# Patient Record
Sex: Female | Born: 1965 | Race: White | Hispanic: No | Marital: Married | Smoking: Never smoker
Health system: Southern US, Community
[De-identification: ages and names within clinical notes are randomized; demographics above are authoritative.]

## PROBLEM LIST (undated history)

## (undated) DIAGNOSIS — I341 Nonrheumatic mitral (valve) prolapse: Secondary | ICD-10-CM

## (undated) DIAGNOSIS — T7840XA Allergy, unspecified, initial encounter: Secondary | ICD-10-CM

## (undated) DIAGNOSIS — D473 Essential (hemorrhagic) thrombocythemia: Secondary | ICD-10-CM

## (undated) DIAGNOSIS — I639 Cerebral infarction, unspecified: Secondary | ICD-10-CM

## (undated) DIAGNOSIS — N809 Endometriosis, unspecified: Secondary | ICD-10-CM

## (undated) HISTORY — DX: Essential (hemorrhagic) thrombocythemia: D47.3

## (undated) HISTORY — PX: ROOT CANAL: SHX2363

## (undated) HISTORY — PX: WISDOM TOOTH EXTRACTION: SHX21

---

## 2014-03-22 DIAGNOSIS — I639 Cerebral infarction, unspecified: Secondary | ICD-10-CM

## 2014-03-22 HISTORY — DX: Cerebral infarction, unspecified: I63.9

## 2014-03-23 ENCOUNTER — Inpatient Hospital Stay (HOSPITAL_COMMUNITY)
Admission: EM | Admit: 2014-03-23 | Discharge: 2014-03-28 | DRG: 064 | Disposition: A | Discharge status: Home / Self Care | Payer: Medicaid Other | Attending: Neurology | Admitting: Neurology

## 2014-03-23 ENCOUNTER — Emergency Department (HOSPITAL_COMMUNITY): Payer: Medicaid Other

## 2014-03-23 ENCOUNTER — Encounter (HOSPITAL_COMMUNITY): Payer: Self-pay | Admitting: Emergency Medicine

## 2014-03-23 DIAGNOSIS — R29898 Other symptoms and signs involving the musculoskeletal system: Secondary | ICD-10-CM | POA: Diagnosis present

## 2014-03-23 DIAGNOSIS — G936 Cerebral edema: Secondary | ICD-10-CM | POA: Diagnosis present

## 2014-03-23 DIAGNOSIS — D45 Polycythemia vera: Secondary | ICD-10-CM | POA: Diagnosis present

## 2014-03-23 DIAGNOSIS — F3289 Other specified depressive episodes: Secondary | ICD-10-CM | POA: Diagnosis present

## 2014-03-23 DIAGNOSIS — G8190 Hemiplegia, unspecified affecting unspecified side: Secondary | ICD-10-CM | POA: Diagnosis present

## 2014-03-23 DIAGNOSIS — I619 Nontraumatic intracerebral hemorrhage, unspecified: Secondary | ICD-10-CM | POA: Diagnosis present

## 2014-03-23 DIAGNOSIS — F329 Major depressive disorder, single episode, unspecified: Secondary | ICD-10-CM | POA: Diagnosis present

## 2014-03-23 DIAGNOSIS — E876 Hypokalemia: Secondary | ICD-10-CM

## 2014-03-23 DIAGNOSIS — I629 Nontraumatic intracranial hemorrhage, unspecified: Secondary | ICD-10-CM

## 2014-03-23 DIAGNOSIS — D72829 Elevated white blood cell count, unspecified: Secondary | ICD-10-CM | POA: Diagnosis present

## 2014-03-23 DIAGNOSIS — I639 Cerebral infarction, unspecified: Secondary | ICD-10-CM | POA: Diagnosis present

## 2014-03-23 DIAGNOSIS — I059 Rheumatic mitral valve disease, unspecified: Secondary | ICD-10-CM | POA: Diagnosis present

## 2014-03-23 DIAGNOSIS — Z79899 Other long term (current) drug therapy: Secondary | ICD-10-CM

## 2014-03-23 HISTORY — DX: Cerebral infarction, unspecified: I63.9

## 2014-03-23 HISTORY — DX: Nonrheumatic mitral (valve) prolapse: I34.1

## 2014-03-23 LAB — COMPREHENSIVE METABOLIC PANEL
ALK PHOS: 71 U/L (ref 39–117)
ALT: 11 U/L (ref 0–35)
AST: 15 U/L (ref 0–37)
Albumin: 3.6 g/dL (ref 3.5–5.2)
BUN: 17 mg/dL (ref 6–23)
CO2: 27 meq/L (ref 19–32)
Calcium: 8.7 mg/dL (ref 8.4–10.5)
Chloride: 104 mEq/L (ref 96–112)
Creatinine, Ser: 0.58 mg/dL (ref 0.50–1.10)
GLUCOSE: 103 mg/dL — AB (ref 70–99)
Potassium: 4.2 mEq/L (ref 3.7–5.3)
SODIUM: 141 meq/L (ref 137–147)
Total Bilirubin: 0.4 mg/dL (ref 0.3–1.2)
Total Protein: 6.4 g/dL (ref 6.0–8.3)

## 2014-03-23 LAB — DIFFERENTIAL
Basophils Absolute: 0.2 10*3/uL — ABNORMAL HIGH (ref 0.0–0.1)
Basophils Relative: 1 % (ref 0–1)
EOS ABS: 0.3 10*3/uL (ref 0.0–0.7)
Eosinophils Relative: 2 % (ref 0–5)
LYMPHS ABS: 1.9 10*3/uL (ref 0.7–4.0)
LYMPHS PCT: 15 % (ref 12–46)
MONOS PCT: 5 % (ref 3–12)
Monocytes Absolute: 0.6 10*3/uL (ref 0.1–1.0)
Neutro Abs: 9.6 10*3/uL — ABNORMAL HIGH (ref 1.7–7.7)
Neutrophils Relative %: 77 % (ref 43–77)

## 2014-03-23 LAB — CBC
HCT: 53.6 % — ABNORMAL HIGH (ref 36.0–46.0)
HEMOGLOBIN: 18 g/dL — AB (ref 12.0–15.0)
MCH: 32.4 pg (ref 26.0–34.0)
MCHC: 33.6 g/dL (ref 30.0–36.0)
MCV: 96.6 fL (ref 78.0–100.0)
PLATELETS: 584 10*3/uL — AB (ref 150–400)
RBC: 5.55 MIL/uL — AB (ref 3.87–5.11)
RDW: 13.3 % (ref 11.5–15.5)
WBC: 12.4 10*3/uL — AB (ref 4.0–10.5)

## 2014-03-23 LAB — URINALYSIS, ROUTINE W REFLEX MICROSCOPIC
Bilirubin Urine: NEGATIVE
GLUCOSE, UA: NEGATIVE mg/dL
Ketones, ur: NEGATIVE mg/dL
Leukocytes, UA: NEGATIVE
Nitrite: NEGATIVE
PH: 6 (ref 5.0–8.0)
Protein, ur: NEGATIVE mg/dL
SPECIFIC GRAVITY, URINE: 1.01 (ref 1.005–1.030)
Urobilinogen, UA: 0.2 mg/dL (ref 0.0–1.0)

## 2014-03-23 LAB — TROPONIN I

## 2014-03-23 LAB — APTT: aPTT: 33 seconds (ref 24–37)

## 2014-03-23 LAB — URINE MICROSCOPIC-ADD ON

## 2014-03-23 LAB — RAPID URINE DRUG SCREEN, HOSP PERFORMED
Amphetamines: NOT DETECTED
Barbiturates: NOT DETECTED
Benzodiazepines: NOT DETECTED
Cocaine: NOT DETECTED
OPIATES: NOT DETECTED
Tetrahydrocannabinol: NOT DETECTED

## 2014-03-23 LAB — PROTIME-INR
INR: 1.06 (ref 0.00–1.49)
PROTHROMBIN TIME: 13.6 s (ref 11.6–15.2)

## 2014-03-23 LAB — ETHANOL: Alcohol, Ethyl (B): <11 mg/dL (ref 0–11)

## 2014-03-23 NOTE — ED Provider Notes (Signed)
CSN: [id number]     Arrival date & time 03/23/14  1940 History   First MD Initiated Contact with Patient 03/23/14 2045     Chief Complaint  Patient presents with  . Weakness     (Consider location/radiation/quality/duration/timing/severity/associated sxs/prior Treatment) HPI 48 year old healthy female LKW noon yesterday noticed acute onset yesterday at about noon of left arm and leg of moderate weakness with slight numbness with no headache no trauma no change in speech vision swallowing or understanding no vertigo no chest pain no palpitations no shortness of breath no lightheadedness no syncope no pain but she has had persistent moderate left arm and leg weakness since yesterday; not a code stroke candidate. There is no treatment prior to arrival. Symptoms are constant and not worsening but not improving. Past Medical History  Diagnosis Date  . Mitral valve prolapse   . Stroke 03/22/2014   Past Surgical History  Procedure Laterality Date  . Root canal     History reviewed. No pertinent family history. History  Substance Use Topics  . Smoking status: Never Smoker   . Smokeless tobacco: Not on file  . Alcohol Use: Yes     Comment: rarely per pt   OB History   Grav Para Term Preterm Abortions TAB SAB Ect Mult Living                 Review of Systems 10 Systems reviewed and are negative for acute change except as noted in the HPI.   Allergies  Review of patient's allergies indicates no known allergies.  Home Medications   Prior to Admission medications   Not on File   BP 130/53  Pulse 70  Temp(Src) 98 F (36.7 C) (Oral)  Resp 16  Ht 5\' 4"  (1.626 m)  Wt 113 lb 5.1 oz (51.4 kg)  BMI 19.44 kg/m2  SpO2 98%  LMP 03/16/2014 Physical Exam  Nursing note and vitals reviewed. Constitutional: She is oriented to person, place, and time.  Awake, alert, nontoxic appearance with baseline speech for patient.  HENT:  Head: Atraumatic.  Mouth/Throat: No oropharyngeal exudate.   Eyes: EOM are normal. Pupils are equal, round, and reactive to light. Right eye exhibits no discharge. Left eye exhibits no discharge.  Neck: Neck supple.  Cardiovascular: Normal rate and regular rhythm.   No murmur heard. Pulmonary/Chest: Effort normal and breath sounds normal. No stridor. No respiratory distress. She has no wheezes. She has no rales. She exhibits no tenderness.  Abdominal: Soft. Bowel sounds are normal. She exhibits no mass. There is no tenderness. There is no rebound.  Musculoskeletal: She exhibits no tenderness.  Baseline ROM, moves extremities with no obvious new focal weakness.  Lymphadenopathy:    She has no cervical adenopathy.  Neurological: She is alert and oriented to person, place, and time.  Awake, alert, cooperative and aware of situation; motor strength 5 out of 5 right arm and leg and 4/5 left arm and leg; sensation normal to light touch right arm and leg but slight decreased light touch left arm and leg; peripheral visual fields full to confrontation; no facial asymmetry; tongue midline; major cranial nerves appear intact; mild left arm and leg pronator drift, normal finger to nose bilaterally, gait without new ataxia but does have abnormal gait due to left leg weakness.  Skin: No rash noted.  Psychiatric: She has a normal mood and affect.    ED Course  Procedures (including critical care time) Patient / Family / Caregiver understand and agree with initial  ED impression and plan with expectations set for ED visit. Pt stable in ED with no significant deterioration in condition. [facility] paged. 2240 D/w Neuro for transfer to [facility]. 2250 Patient / Family / Caregiver informed of clinical course, understand medical decision-making process, and agree with plan. [place] Performed by: Babette Relic Total critical care time: 22min (risk of re-bleed and decompensation; transfer to ICU) Critical care time was exclusive of separately billable  procedures and treating other patients. Critical care was necessary to treat or prevent imminent or life-threatening deterioration. Critical care was time spent personally by me on the following activities: development of treatment plan with patient and/or surrogate as well as nursing, discussions with consultants, evaluation of patient's response to treatment, examination of patient, obtaining history from patient or surrogate, ordering and performing treatments and interventions, ordering and review of laboratory studies, ordering and review of radiographic studies, pulse oximetry and re-evaluation of patient's condition.  Labs Review Labs Reviewed  CBC - Abnormal; Notable for the following:    WBC 12.4 (*)    RBC 5.55 (*)    Hemoglobin 18.0 (*)    HCT 53.6 (*)    Platelets 584 (*)    All other components within normal limits  DIFFERENTIAL - Abnormal; Notable for the following:    Neutro Abs 9.6 (*)    Basophils Absolute 0.2 (*)    All other components within normal limits  COMPREHENSIVE METABOLIC PANEL - Abnormal; Notable for the following:    Glucose, Bld 103 (*)    All other components within normal limits  URINALYSIS, ROUTINE W REFLEX MICROSCOPIC - Abnormal; Notable for the following:    Hgb urine dipstick MODERATE (*)    All other components within normal limits  BASIC METABOLIC PANEL - Abnormal; Notable for the following:    Potassium 3.6 (*)    Calcium 8.1 (*)    All other components within normal limits  CBC - Abnormal; Notable for the following:    WBC 11.4 (*)    RBC 5.22 (*)    Hemoglobin 17.1 (*)    HCT 51.1 (*)    Platelets 514 (*)    All other components within normal limits  C3 COMPLEMENT - Abnormal; Notable for the following:    C3 Complement 79 (*)    All other components within normal limits  ANTIPHOSPHOLIPID SYNDROME EVAL, BLD - Abnormal; Notable for the following:    Anticardiolipin IgA 7 (*)    Anticardiolipin IgG 0 (*)    Anticardiolipin IgM 2 (*)     All other components within normal limits  CBC - Abnormal; Notable for the following:    WBC 11.2 (*)    RBC 5.42 (*)    Hemoglobin 17.6 (*)    HCT 52.9 (*)    Platelets 553 (*)    All other components within normal limits  MRSA PCR SCREENING  CULTURE, BLOOD (ROUTINE X 2)  CULTURE, BLOOD (ROUTINE X 2)  ETHANOL  PROTIME-INR  APTT  URINE RAPID DRUG SCREEN (HOSP PERFORMED)  TROPONIN I  URINE MICROSCOPIC-ADD ON  TROPONIN I  TROPONIN I  TROPONIN I  ANA  C4 COMPLEMENT  SEDIMENTATION RATE  COMPLEMENT, TOTAL    Imaging Review US Renal  03/26/2014   CLINICAL DATA:  polycythemia in setting a brain hmg, evaluate kidneys  EXAM: RENAL/URINARY TRACT ULTRASOUND COMPLETE  COMPARISON:  None.  FINDINGS: Right Kidney:  Length: 11.9 cm. Echogenicity within normal limits. No mass or hydronephrosis visualized.  Left Kidney:  Length: 12.5 cm. Echogenicity within normal limits. No mass or hydronephrosis visualized.  Bladder:  Appears normal for degree of bladder distention.  Heterogeneous hypoechoic mass in the left adnexum with punctate calcifications. This finding is partially evaluated and further evaluation dedicated pelvic ultrasound recommended.  IMPRESSION: Indeterminate hypoechoic mass left adnexa further evaluation with dedicated pelvic ultrasound recommended. Otherwise unremarkable renal ultrasound   Electronically Signed   By: Margaree Mackintosh M.D.   On: 03/26/2014 18:11   Ir Angio Intra Extracran Sel Com Carotid Innominate Bilat Mod Sed  03/26/2014   CLINICAL DATA:  Right frontal intracranial hemorrhage.  EXAM: BILATERAL COMMON CAROTID AND INNOMINATE ANGIOGRAPHY AND BILATERAL VERTEBRAL ARTERY ANGIOGRAMS:  ANESTHESIA/SEDATION: Conscious sedation.  MEDICATIONS: Versed 1 mg IV.  Fentanyl 25 mcg IV.  CONTRAST:  24mL OMNIPAQUE IOHEXOL 300 MG/ML  SOLN  PROCEDURE: Following a full explanation of the procedure along with the potential associated complications, an informed witnessed consent was obtained.  The  right groin was prepped and draped in the usual sterile fashion. Thereafter, using a modified Seldinger technique, transfemoral access into the right common femoral artery was obtained without difficulty. Over a 0.035 inch guidewire, a 5 French Pinnacle sheath was inserted. Through this, and also over a 0.035 inch guidewire, a 5 French JB1 catheter was advanced to the aortic arch and selectively positioned in the right vertebral artery, the right common carotid artery, the left common carotid artery and left vertebral artery.  The patient tolerated the procedure well.  COMPLICATIONS: None immediate  FINDINGS: The right vertebral artery origin is normal.  The vessel opacifies normally to the cranial skull base. There is normal opacification of the right posterior inferior cerebellar artery and the right vertebrobasilar junction.  The opacified portions of the basilar artery, the posterior cerebral arteries, the superior cerebellar arteries and the anterior-inferior cerebellar arteries are normal in the capillary and the venous phases. Unopacified blood is seen in the basilar artery from the contralateral more dominant vertebral artery.  The right common carotid arteriogram demonstrates the right external carotid artery and its major branches to be normal.  The right internal carotid artery at the bulb to the cranial skull base opacifies normally.  The petrous, the cavernous and the supraclinoid segments are widely patent.  A right posterior communicating artery is seen opacifying the right posterior cerebral artery distribution.  The right middle cerebral artery and the right anterior cerebral artery are seen to opacify into the capillary and the venous phases.  There is mild abnormal prominence of the distal right pericallosal artery in the subcortical cortical area associated with an underlying, moderate sized area of hypoperfusion at the site of the hemorrhage. However, there is no evidence angiographically at  this time of arteriovenous shunting, or appearance of a nidus, or dissections or of intraluminal filling defects. The adjacent cortical veins appear widely patent with antegrade brisk flow into the superior sagittal sinus. The dural venous sinuses otherwise remain widely patent.  The left common carotid arteriogram demonstrates left external carotid artery and its major branches to be normal .  Left internal carotid artery at the bulb to the cranial skull base opacifies normally. The petrous the cavernous and the supraclinoid segments opacify normally.  A left posterior communicating artery is seen opacifying the left posterior cerebral artery distribution.  The left middle and the left anterior cerebral arteries opacify normally into the capillary and the venous phases.  The left vertebral artery origin is normal.  The vessel opacifies normally to  the cranial skull base. Normal opacification of the left posterior inferior cerebellar artery and the left vertebrobasilar junction is seen.  The basilar artery, the posterior cerebral arteries, superior cerebellar arteries and the anterior inferior cerebellar arteries opacify normally into the capillary and the venous phases.  IMPRESSION: Angiographically mildly prominent distal right pericallosal artery, without evidence of arteriovenous shunting, or of angiographic presence of a nidus being noted in the right anterior to mid frontal cortical subcortical area. No evidence of a dural AV fistula, dissections or of intraluminal filling defects seen.  Venous drainage within normal limits.  Given the above angiographic findings, a follow-up study is suggested in order to evaluate for possibly of an underlying vessel malformation not visualized at this time.   Electronically Signed   By: Luanne Bras M.D.   On: 03/25/2014 11:12     EKG Interpretation None     ECG Muse not working: Sinus rhythm, ventricular rate 71, normal axis, left ventricular hypertrophy septal  Q waves, no comparison ECG available MDM   Final diagnoses:  Intracranial hemorrhage    The patient appears reasonably stabilized for transfer considering the current resources, flow, and capabilities available in the ED at this time, and I doubt any other [facility] requiring further screening and/or treatment in the ED prior to transfer.    Babette Relic, MD 03/26/14 [phone]

## 2014-03-23 NOTE — ED Notes (Signed)
Report given to Denyse Amass, RN at [facility] Neuro ICU

## 2014-03-23 NOTE — ED Notes (Signed)
Patient reports "I just don't feel right and my left side is weak." Patient reports noticed yesterday while attempting to fold clothes she couldn't fold them, and she states she had trouble getting jacket on. Reports today, approximately an hour ago, she lost her balance and fell backwards.

## 2014-03-24 ENCOUNTER — Inpatient Hospital Stay (HOSPITAL_COMMUNITY): Payer: Medicaid Other

## 2014-03-24 ENCOUNTER — Encounter (HOSPITAL_COMMUNITY): Payer: Self-pay | Admitting: Radiology

## 2014-03-24 DIAGNOSIS — R58 Hemorrhage, not elsewhere classified: Secondary | ICD-10-CM

## 2014-03-24 DIAGNOSIS — I639 Cerebral infarction, unspecified: Secondary | ICD-10-CM | POA: Diagnosis present

## 2014-03-24 DIAGNOSIS — I629 Nontraumatic intracranial hemorrhage, unspecified: Secondary | ICD-10-CM

## 2014-03-24 LAB — TROPONIN I
Troponin I: <0.3 ng/mL (ref <0.30)
Troponin I: <0.3 ng/mL (ref <0.30)

## 2014-03-24 LAB — MRSA PCR SCREENING: MRSA by PCR: NEGATIVE

## 2014-03-24 MED ORDER — SODIUM CHLORIDE 0.9 % IV BOLUS (SEPSIS)
1000.0000 mL | Freq: Once | INTRAVENOUS | Status: AC
  Administered 2014-03-24: 1000 mL via INTRAVENOUS

## 2014-03-24 MED ORDER — IOHEXOL 350 MG/ML SOLN
50.0000 mL | Freq: Once | INTRAVENOUS | Status: AC | PRN
  Administered 2014-03-24: 50 mL via INTRAVENOUS

## 2014-03-24 MED ORDER — ACETAMINOPHEN 325 MG PO TABS
650.0000 mg | ORAL_TABLET | ORAL | Status: DC | PRN
  Administered 2014-03-24 – 2014-03-27 (×7): 650 mg via ORAL
  Filled 2014-03-24 (×7): qty 2

## 2014-03-24 MED ORDER — CLINDAMYCIN HCL 300 MG PO CAPS
450.0000 mg | ORAL_CAPSULE | Freq: Three times a day (TID) | ORAL | Status: DC
  Filled 2014-03-24 (×2): qty 1

## 2014-03-24 MED ORDER — ACETAMINOPHEN 500 MG PO TABS
500.0000 mg | ORAL_TABLET | Freq: Once | ORAL | Status: AC
  Administered 2014-03-24: 500 mg via ORAL
  Filled 2014-03-24: qty 1

## 2014-03-24 MED ORDER — SENNOSIDES-DOCUSATE SODIUM 8.6-50 MG PO TABS
1.0000 | ORAL_TABLET | Freq: Two times a day (BID) | ORAL | Status: DC
  Administered 2014-03-24 – 2014-03-27 (×6): 1 via ORAL
  Filled 2014-03-24 (×8): qty 1

## 2014-03-24 MED ORDER — ACETAMINOPHEN 650 MG RE SUPP: 650.0000 mg | RECTAL | Status: DC | PRN

## 2014-03-24 MED ORDER — PANTOPRAZOLE SODIUM 40 MG IV SOLR
40.0000 mg | Freq: Every day | INTRAVENOUS | Status: DC
  Administered 2014-03-24 (×2): 40 mg via INTRAVENOUS
  Filled 2014-03-24 (×3): qty 40

## 2014-03-24 MED ORDER — CLINDAMYCIN HCL 300 MG PO CAPS
450.0000 mg | ORAL_CAPSULE | Freq: Three times a day (TID) | ORAL | Status: AC
  Administered 2014-03-24 – 2014-03-26 (×9): 450 mg via ORAL
  Filled 2014-03-24 (×11): qty 1

## 2014-03-24 MED ORDER — SODIUM CHLORIDE 0.9 % IV SOLN
INTRAVENOUS | Status: DC
  Administered 2014-03-24 – 2014-03-27 (×3): via INTRAVENOUS

## 2014-03-24 MED ORDER — LABETALOL HCL 5 MG/ML IV SOLN
10.0000 mg | INTRAVENOUS | Status: DC | PRN
  Administered 2014-03-24 (×2): 5 mg via INTRAVENOUS
  Filled 2014-03-24: qty 4
  Filled 2014-03-24: qty 8

## 2014-03-24 NOTE — Progress Notes (Signed)
Nutrition Brief Note  Patient identified on the Malnutrition Screening Tool (MST) Report  Pt admitted for stroke work up and is currently in MRI. Daughter in room and provides hx. Per daughter, pt and husband separated 9 months ago. After their separation pt went on a juice fasting diet to lose weight. Pt lost almost 30 lb during that time. Pt's weight has now been stable for the last 4 months and pt is eating normally again.   Wt Readings from Last 15 Encounters:  03/24/14 113 lb 5.1 oz (51.4 kg)    Body mass index is 19.44 kg/(m^2). BMI now WNL.   Current diet order is NPO for tests. Labs and medications reviewed.   No nutrition interventions warranted at this time. If nutrition issues arise, please consult RD.   [place], [place], [place] Pager [phone] After Hours Pager

## 2014-03-24 NOTE — Progress Notes (Signed)
Stroke Team Progress Note  HISTORY Regina [name] is a 48 y.o. female with a history of MVP who presents with left-sided weakness that started yesterday 5/2 around lunchtime. She states that she noticed that she was having difficulty folding clothes. Later that day when she lay down for a nap, she noticed some headache. This has since resolved. Today 03/23/2014, she stumbled and fell against a dishwasher and her child insisted that she come into the emergency room. CT demonstrated a right frontal lobar hemorrhage. Patient was not administered TPA secondary to hemorrahge. She was admitted to the neuro ICU for further evaluation and treatment. No prior history of DVT, pulmonary embolism, rash, joint problems. Denies history of drug abuse., Birth control pills,  SUBJECTIVE Her RN is at the bedside, no family.  Overall she feels her condition is stable. 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 Kids:  81, 18, 17 & 15  OBJECTIVE Most recent Vital Signs: Filed Vitals:   03/24/14 0530 03/24/14 0600 03/24/14 0630 03/24/14 0700  BP: 132/73 131/75 141/83 139/80  Pulse: 57 53 57 66  Temp:      TempSrc:      Resp: 14 12 16 16   Height:      Weight:      SpO2: 95% 97% 96% 100%   CBG (last 3)  No results found for this basename: GLUCAP,  in the last 72 hours  IV Fluid Intake:     MEDICATIONS  . clindamycin  450 mg Oral 3 times per day  . pantoprazole (PROTONIX) IV  40 mg Intravenous QHS  . senna-docusate  1 tablet Oral BID   PRN:  acetaminophen, acetaminophen, labetalol  Diet:  NPO  Activity:  Bedrest DVT Prophylaxis:  SCDs   CLINICALLY SIGNIFICANT STUDIES Basic Metabolic Panel:  Recent Labs Lab 03/23/14 2052  NA 141  K 4.2  CL 104  CO2 27  GLUCOSE 103*  BUN 17  CREATININE 0.58  CALCIUM 8.7   Liver Function Tests:  Recent Labs Lab 03/23/14 2052  AST 15  ALT 11  ALKPHOS 71  BILITOT 0.4  PROT 6.4  ALBUMIN 3.6   CBC:  Recent Labs Lab 03/23/14 2052  WBC 12.4*   NEUTROABS 9.6*  HGB 18.0*  HCT 53.6*  MCV 96.6  PLT 584*   Coagulation:  Recent Labs Lab 03/23/14 2052  LABPROT 13.6  INR 1.06   Cardiac Enzymes:  Recent Labs Lab 03/23/14 2052  TROPONINI <0.30   Urinalysis:  Recent Labs Lab 03/23/14 2252  COLORURINE YELLOW  LABSPEC 1.010  PHURINE 6.0  GLUCOSEU NEGATIVE  HGBUR MODERATE*  BILIRUBINUR NEGATIVE  KETONESUR NEGATIVE  PROTEINUR NEGATIVE  UROBILINOGEN 0.2  NITRITE NEGATIVE  LEUKOCYTESUR NEGATIVE   Lipid Panel No results found for this basename: chol, trig, hdl, cholhdl, vldl, ldlcalc   HgbA1C  No results found for this basename: HGBA1C    Urine Drug Screen:     Component Value Date/Time   LABOPIA NONE DETECTED 03/23/2014 2252   COCAINSCRNUR NONE DETECTED 03/23/2014 2252   LABBENZ NONE DETECTED 03/23/2014 2252   AMPHETMU NONE DETECTED 03/23/2014 2252   THCU NONE DETECTED 03/23/2014 2252   LABBARB NONE DETECTED 03/23/2014 2252    Alcohol Level:  Recent Labs Lab 03/23/14 2052  ETH <11    CT of the brain  03/23/2014    There is a 2 x 3.4 cm acute right frontal intraparenchymal hematoma with mild surrounding edema. There is no mass effect, hydrocephalus or transtentorial herniation.  CT angio of the head 03/24/2014   Contracting right frontal 33 x 18 mm intraparenchymal hematoma, no new hemorrhage. Local mass effect without midline shift.  Normal CT angiogram of the head.     Cerebral angio  MRI of the brain    2D Echocardiogram    CXR    EKG  normal sinus rhythm. For complete results please see formal report.   Therapy Recommendations   Physical Exam   Frail young Caucasian lady not in distress.Awake alert. Afebrile. Head is nontraumatic. Neck is supple without bruit. Hearing is normal. Cardiac exam no murmur or gallop. Lungs are clear to auscultation. Distal pulses are well felt. Neurological Exam : Awake alert oriented x 3 normal speech and language. Mild left lower face asymmetry. Tongue midline. No drift. Mild  diminished fine finger movements on left. Orbits right over left upper extremity. Mild left grip weak.. Mild diminished left hemibody sensation . Normal coordination. Gait deferred. ASSESSMENT Ms. Regina [name] is a 48 y.o. female presenting with left sided weakness. Imaging confirms a right frontal IPH. Hemorrhage etiology unclear. On no antithrombotics prior to admission. Patient with resultant left hemiparesis. Stroke work up underway.   MVP  Hx 1 miscarriage at 12 weeks  Root canal in March, 2 weeks abx prior to procedure; has had 2 rounds abx post - clindamycin 450 3 times a day x3 days given to complete her previously scheduled course of antibiotics - need to obtain records to see if finishing the course is necessary  Hospital day # 1  TREATMENT/PLAN  1L bolus NS followed by 100 cc/hr  Cerebral angiogram today  Goal SBP < 180  Transfer to the floor  Travel without RN  OOB. Therapy evals  MRI today  blood cultures x 2  BMET, CBC in am  Follow up length of abx therapy  Burnetta Sabin, MSN, RN, ANVP-BC, AGPCNP-BC [facility] Pager: [phone] 03/24/2014 7:31 AM This patient is critically ill and at significant risk of neurological worsening, death and care requires constant monitoring of vital signs, hemodynamics,respiratory and cardiac monitoring,review of multiple databases, neurological assessment, discussion with family, other specialists and medical decision making of high complexity. I spent 30 minutes of neurocritical care time  in the care of  this patient. I have personally obtained a history, examined the patient, evaluated imaging results, and formulated the assessment and plan of care. I agree with the above.  Antony Contras, MD  To contact Stroke Continuity provider, please refer to [email or web address] After hours, contact General Neurology

## 2014-03-24 NOTE — Progress Notes (Signed)
Utilization Review Completed.Neoma Laming T Dowell5/02/2014

## 2014-03-24 NOTE — Progress Notes (Signed)
Echocardiogram 2D Echocardiogram has been performed.  [place] 03/24/2014, 11:21 AM

## 2014-03-24 NOTE — Consult Note (Signed)
HPI: Regina [name] is an 48 y.o. female who has been admitted for frontal hemorrhage after becoming symptomatic on the left side and falling at home. She has improved neurologically since admission and her repeat CT shows stability of right frontal hemorrhage. IR is asked to do formal cerebral arteriogram for further assessment. PT seen with family at bedside. PMHx and meds reviewed.  Past Medical History:  Past Medical History  Diagnosis Date  . Mitral valve prolapse     Past Surgical History:  Past Surgical History  Procedure Laterality Date  . Root canal      Family History: History reviewed. No pertinent family history.  Social History:  reports that she has never smoked. She does not have any smokeless tobacco history on file. She reports that she drinks alcohol. She reports that she does not use illicit drugs.  Allergies: No Known Allergies  Medications:   Medication List    ASK your doctor about these medications       BIOTIN PO  Take 1 capsule by mouth daily.     penicillin v potassium 250 MG tablet  Commonly known as:  VEETID  Take 250 mg by mouth 3 (three) times daily. For root canal. 14 days started on 4/23        Please HPI for pertinent positives, otherwise complete 10 system ROS negative.  Physical Exam: BP 134/81  Pulse 71  Temp(Src) 97.3 F (36.3 C) (Oral)  Resp 18  Ht $R'5\' 4"'em$  (1.626 m)  Wt 113 lb 5.1 oz (51.4 kg)  BMI 19.44 kg/m2  SpO2 96%  LMP 03/16/2014 Body mass index is 19.44 kg/(m^2).   General Appearance:  Alert, cooperative, no distress, appears stated age  Head:  Normocephalic, without obvious abnormality, atraumatic  ENT: Unremarkable  Neck: Supple, symmetrical, trachea midline  Lungs:   Clear to auscultation bilaterally, no w/r/r,  Chest Wall:  No tenderness or deformity  Heart:  Regular rate and rhythm, S1, S2 normal, no murmur, rub or gallop.  Abdomen:   Soft, non-tender, non distended.  Extremities: Extremities normal,  atraumatic, no cyanosis or edema  Pulses: 2+ and symmetric femoral and pedal  Neurologic: Normal affect, still with 4/5 strength on left   Results for orders placed during the hospital encounter of 03/23/14 (from the past 48 hour(s))  ETHANOL     Status: None   Collection Time    03/23/14  8:52 PM      Result Value Ref Range   Alcohol, Ethyl (B) <11  0 - 11 mg/dL   Comment:            LOWEST DETECTABLE LIMIT FOR     SERUM ALCOHOL IS 11 mg/dL     FOR MEDICAL PURPOSES ONLY  PROTIME-INR     Status: None   Collection Time    03/23/14  8:52 PM      Result Value Ref Range   Prothrombin Time 13.6  11.6 - 15.2 seconds   INR 1.06  0.00 - 1.49  APTT     Status: None   Collection Time    03/23/14  8:52 PM      Result Value Ref Range   aPTT 33  24 - 37 seconds  CBC     Status: Abnormal   Collection Time    03/23/14  8:52 PM      Result Value Ref Range   WBC 12.4 (*) 4.0 - 10.5 K/uL   RBC 5.55 (*) 3.87 - 5.11 MIL/uL  Hemoglobin 18.0 (*) 12.0 - 15.0 g/dL   HCT 53.6 (*) 36.0 - 46.0 %   MCV 96.6  78.0 - 100.0 fL   MCH 32.4  26.0 - 34.0 pg   MCHC 33.6  30.0 - 36.0 g/dL   RDW 13.3  11.5 - 15.5 %   Platelets 584 (*) 150 - 400 K/uL  DIFFERENTIAL     Status: Abnormal   Collection Time    03/23/14  8:52 PM      Result Value Ref Range   Neutrophils Relative % 77  43 - 77 %   Neutro Abs 9.6 (*) 1.7 - 7.7 K/uL   Lymphocytes Relative 15  12 - 46 %   Lymphs Abs 1.9  0.7 - 4.0 K/uL   Monocytes Relative 5  3 - 12 %   Monocytes Absolute 0.6  0.1 - 1.0 K/uL   Eosinophils Relative 2  0 - 5 %   Eosinophils Absolute 0.3  0.0 - 0.7 K/uL   Basophils Relative 1  0 - 1 %   Basophils Absolute 0.2 (*) 0.0 - 0.1 K/uL  COMPREHENSIVE METABOLIC PANEL     Status: Abnormal   Collection Time    03/23/14  8:52 PM      Result Value Ref Range   Sodium 141  137 - 147 mEq/L   Potassium 4.2  3.7 - 5.3 mEq/L   Chloride 104  96 - 112 mEq/L   CO2 27  19 - 32 mEq/L   Glucose, Bld 103 (*) 70 - 99 mg/dL   BUN 17   6 - 23 mg/dL   Creatinine, Ser 0.58  0.50 - 1.10 mg/dL   Calcium 8.7  8.4 - 10.5 mg/dL   Total Protein 6.4  6.0 - 8.3 g/dL   Albumin 3.6  3.5 - 5.2 g/dL   AST 15  0 - 37 U/L   ALT 11  0 - 35 U/L   Alkaline Phosphatase 71  39 - 117 U/L   Total Bilirubin 0.4  0.3 - 1.2 mg/dL   GFR calc non Af Amer >90  >90 mL/min   GFR calc Af Amer >90  >90 mL/min   Comment: (NOTE)     The eGFR has been calculated using the CKD EPI equation.     This calculation has not been validated in all clinical situations.     eGFR's persistently <90 mL/min signify possible Chronic Kidney     Disease.  TROPONIN I     Status: None   Collection Time    03/23/14  8:52 PM      Result Value Ref Range   Troponin I <0.30  <0.30 ng/mL   Comment:            Due to the release kinetics of cTnI,     a negative result within the first hours     of the onset of symptoms does not rule out     myocardial infarction with certainty.     If myocardial infarction is still suspected,     repeat the test at appropriate intervals.  URINE RAPID DRUG SCREEN (HOSP PERFORMED)     Status: None   Collection Time    03/23/14 10:52 PM      Result Value Ref Range   Opiates NONE DETECTED  NONE DETECTED   Cocaine NONE DETECTED  NONE DETECTED   Benzodiazepines NONE DETECTED  NONE DETECTED   Amphetamines NONE DETECTED  NONE DETECTED   Tetrahydrocannabinol NONE  DETECTED  NONE DETECTED   Barbiturates NONE DETECTED  NONE DETECTED   Comment:            DRUG SCREEN FOR MEDICAL PURPOSES     ONLY.  IF CONFIRMATION IS NEEDED     FOR ANY PURPOSE, NOTIFY LAB     WITHIN 5 DAYS.                LOWEST DETECTABLE LIMITS     FOR URINE DRUG SCREEN     Drug Class       Cutoff (ng/mL)     Amphetamine      1000     Barbiturate      200     Benzodiazepine   673     Tricyclics       419     Opiates          300     Cocaine          300     THC              50  URINALYSIS, ROUTINE W REFLEX MICROSCOPIC     Status: Abnormal   Collection Time     03/23/14 10:52 PM      Result Value Ref Range   Color, Urine YELLOW  YELLOW   APPearance CLEAR  CLEAR   Specific Gravity, Urine 1.010  1.005 - 1.030   pH 6.0  5.0 - 8.0   Glucose, UA NEGATIVE  NEGATIVE mg/dL   Hgb urine dipstick MODERATE (*) NEGATIVE   Bilirubin Urine NEGATIVE  NEGATIVE   Ketones, ur NEGATIVE  NEGATIVE mg/dL   Protein, ur NEGATIVE  NEGATIVE mg/dL   Urobilinogen, UA 0.2  0.0 - 1.0 mg/dL   Nitrite NEGATIVE  NEGATIVE   Leukocytes, UA NEGATIVE  NEGATIVE  URINE MICROSCOPIC-ADD ON     Status: None   Collection Time    03/23/14 10:52 PM      Result Value Ref Range   Squamous Epithelial / LPF RARE  RARE   RBC / HPF 3-6  <3 RBC/hpf  MRSA PCR SCREENING     Status: None   Collection Time    03/24/14 12:19 AM      Result Value Ref Range   MRSA by PCR NEGATIVE  NEGATIVE   Comment:            The GeneXpert MRSA Assay (FDA     approved for NASAL specimens     only), is one component of a     comprehensive MRSA colonization     surveillance program. It is not     intended to diagnose MRSA     infection nor to guide or     monitor treatment for     MRSA infections.  TROPONIN I     Status: None   Collection Time    03/24/14  8:00 AM      Result Value Ref Range   Troponin I <0.30  <0.30 ng/mL   Comment:            Due to the release kinetics of cTnI,     a negative result within the first hours     of the onset of symptoms does not rule out     myocardial infarction with certainty.     If myocardial infarction is still suspected,     repeat the test at appropriate intervals.   Ct Angio Head W/cm &/or Wo Cm  03/24/2014   CLINICAL DATA:  Follow-up Foley.  EXAM: CT ANGIOGRAPHY HEAD  TECHNIQUE: Multidetector CT imaging of the head was performed using the standard protocol during bolus administration of intravenous contrast. Multiplanar CT image reconstructions and MIPs were obtained to evaluate the vascular anatomy.  CONTRAST:  25mL OMNIPAQUE IOHEXOL 350 MG/ML SOLN   COMPARISON:  CT HEAD W/O CM dated 03/23/2014  FINDINGS: High right frontal 33 x 18 mm (AP by transverse) contracting intraparenchymal cortical the subcortical hematoma, with mild surrounding low-density vasogenic edema. No midline shift. Local mass effect.  The ventricles and sulci are otherwise normal for age. No acute large vascular territory infarcts.  No abnormal extra-axial fluid collections. Basal cisterns are patent. No skull fracture. Right maxillary sinus air-fluid level, unchanged. Mastoid air cells are well aerated. No skull fracture.  Anterior circulation: Normal appearance of the cervical internal carotid arteries, petrous, cavernous and supra clinoid internal carotid arteries. Widely patent anterior communicating artery. Normal appearance of the anterior and middle cerebral arteries.  Posterior circulation: Left vertebral artery is dominant, with normal appearance of the vertebral arteries, vertebrobasilar junction and basilar artery, as well as main branch vessels. Right vertebral artery predominantly terminates in the right posterior inferior cerebellar arteries. Small bilateral posterior communicating arteries are present. Normal appearance of posterior cerebral arteries.  No vascular malformation with particular attention of the right frontal lobe. Though not tailored for evaluation, the associated cortical veins appear patent.  No large vessel occlusion, hemodynamically significant stenosis, dissection, luminal irregularity, contrast extravasation or aneurysm within the anterior nor posterior circulation.  Review of the MIP images confirms the above findings.  IMPRESSION: Contracting right frontal 33 x 18 mm intraparenchymal hematoma, no new hemorrhage. Local mass effect without midline shift.  Normal CT angiogram of the head.   Electronically Signed   By: Elon Alas   On: 03/24/2014 01:50   Ct Head Wo Contrast  03/23/2014   CLINICAL DATA:  Left-sided weakness for 1 day with headache  EXAM:  CT HEAD WITHOUT CONTRAST  TECHNIQUE: Contiguous axial images were obtained from the base of the skull through the vertex without intravenous contrast.  COMPARISON:  None.  FINDINGS: There is a right frontal intraparenchymal hematoma measuring 2 x 3.4 cm with mild surrounding edema. There is no evidence of mass effect, midline shift or extra-axial fluid collections.  The ventricles and sulci are appropriate for the patient's age. The basal cisterns are patent.  Visualized portions of the orbits are unremarkable. There is a small air-fluid level in the right maxillary sinus.  The osseous structures are unremarkable.  IMPRESSION: There is a 2 x 3.4 cm acute right frontal intraparenchymal hematoma with mild surrounding edema. There is no mass effect, hydrocephalus or transtentorial herniation. Critical Value/emergent results were called by telephone at the time of interpretation on 03/23/2014 at 10:35 PM to Dr. Riki Altes , who verbally acknowledged these results.   Electronically Signed   By: Kathreen Devoid   On: 03/23/2014 22:36    Assessment/Plan Right frontal hemorrhage For diagnostic cerebral arteriogram. No intended intervention at this time. Discussed procedure, risks, complications, use of sedation. Labs reviewed. Consent signed in chart  Ascencion Dike PA-C 03/24/2014, 10:22 AM

## 2014-03-24 NOTE — H&P (Signed)
Neurology H&P  CC: Left-sided weakness  History is obtained from: Patient  HPI: Regina [name] is a 48 y.o. female with a history of MVP who presents with left-sided weakness that started yesterday 5/2 around lunchtime. She states that she noticed that she was having difficulty folding clothes. Later that day when she lay down for a nap, she noticed some headache. This has since resolved. Today, she stumbled and fell against a dishwasher and her child insisted that she come into the emergency room.   LKW: Lunchtime, 5/2 tpa given?: no, hemorrhage    ROS: A 14 point ROS was performed and is negative except as noted in the HPI.  Past Medical History  Diagnosis Date  . Mitral valve prolapse     Family History: No history of ICH or clotting disorders  Social History: Tob: Denies  Exam: Current vital signs: BP 130/63  Pulse 79  Temp(Src) 98.9 F (37.2 C) (Oral)  Resp 18  Ht 5\' 4"  (1.626 m)  Wt 48.988 kg (108 lb)  BMI 18.53 kg/m2  SpO2 98%  LMP 03/16/2014 Vital signs in last 24 hours: Temp:  [98 F (36.7 C)-98.9 F (37.2 C)] 98.9 F (37.2 C) (05/03 2302) Pulse Rate:  [68-79] 79 (05/03 2302) Resp:  [14-23] 18 (05/03 2302) BP: (130-151)/(63-91) 130/63 mmHg (05/03 2302) SpO2:  [97 %-100 %] 98 % (05/03 2302) Weight:  [48.988 kg (108 lb)] 48.988 kg (108 lb) (05/03 2027)  General: In bed, NAD CV: Regular rate and rhythm Mental Status: Patient is awake, alert, oriented to person, place, month, year, and situation. Immediate and remote memory are intact. Patient is able to give a clear and coherent history. No signs of aphasia or neglect Cranial Nerves: II: Visual Fields are full. Pupils are equal, round, and reactive to light.   III,IV, VI: EOMI without ptosis or diploplia.  V: Facial sensation is symmetric to temperature VII: Facial movement is symmetric.  VIII: hearing is intact to voice X: Uvula elevates symmetrically XI: Shoulder shrug is symmetric. XII: tongue  is midline without atrophy or fasciculations.  Motor: Tone is normal. Bulk is normal. 5/5 strength was present on the right side, she has 4/5 weakness of the left arm, 4/5 weakness of the left hip flexor, 5 minus/5 left plantar/dorsiflexion Sensory: Sensation is diminished in left arm to light touch Deep Tendon Reflexes: 2+ and symmetric in the biceps and patellae.  Cerebellar: FNF and HKS are intact bilaterally   I have reviewed labs in epic and the results pertinent to this consultation are: INR nml cmp unremarkable CBC-leukocytosis  I have reviewed the images obtained: CT head - right frontal lobar IPH  Impression: 48 year-old female with interparenchymal hemorrhage of unclear etiology. At this time, further investigation with CT angiography and MRI to further elucidate cause would be prudent. Given no history of hypertension and uncertain etiology, I would favor a stricter blood pressure goal of less than 140 SBP at this time.  Possible etiologies include hemorrhagic conversion of infarct, AVM, hemorrhagic mass.   I am not certain of what antibiotic she was on, but clindamycin would be an appropriate choice for dental procedure and is a 3 times a day medication and therefore we'll start this to complete her previously prescribed course. Records could be obtained to see if this really is necessary.  Recommendations: 1) clindamycin 450 3 times a day x3 days to complete her previously scheduled course of antibiotics, to obtain records tomorrow to see if finishing the course is necessary  2) goal SBP less than 140 3) labetalol for elevated BP 4) CT angiogram of the head 5) MRI brain 6) no anticoagulants or anti-thrombotic 7) continue to monitor the ICU with frequent neuro checks   This patient is critically ill and at significant risk of neurological worsening, death and care requires constant monitoring of vital signs, hemodynamics,respiratory and cardiac monitoring, neurological  assessment, discussion with family, other specialists and medical decision making of high complexity. I spent 60 minutes of neurocritical care time  in the care of  this patient.  Roland Rack, MD [facility] [phone]  If 7pm- 7am, please page neurology on call as listed in [place]. 03/24/2014  12:41 AM

## 2014-03-25 ENCOUNTER — Inpatient Hospital Stay (HOSPITAL_COMMUNITY): Payer: Medicaid Other

## 2014-03-25 DIAGNOSIS — I629 Nontraumatic intracranial hemorrhage, unspecified: Secondary | ICD-10-CM

## 2014-03-25 LAB — CBC
HCT: 51.1 % — ABNORMAL HIGH (ref 36.0–46.0)
HEMOGLOBIN: 17.1 g/dL — AB (ref 12.0–15.0)
MCH: 32.8 pg (ref 26.0–34.0)
MCHC: 33.5 g/dL (ref 30.0–36.0)
MCV: 97.9 fL (ref 78.0–100.0)
Platelets: 514 10*3/uL — ABNORMAL HIGH (ref 150–400)
RBC: 5.22 MIL/uL — ABNORMAL HIGH (ref 3.87–5.11)
RDW: 13.5 % (ref 11.5–15.5)
WBC: 11.4 10*3/uL — ABNORMAL HIGH (ref 4.0–10.5)

## 2014-03-25 LAB — BASIC METABOLIC PANEL
BUN: 8 mg/dL (ref 6–23)
CO2: 24 mEq/L (ref 19–32)
Calcium: 8.1 mg/dL — ABNORMAL LOW (ref 8.4–10.5)
Chloride: 105 mEq/L (ref 96–112)
Creatinine, Ser: 0.58 mg/dL (ref 0.50–1.10)
GFR calc Af Amer: >90 mL/min (ref >90)
GFR calc non Af Amer: >90 mL/min (ref >90)
GLUCOSE: 84 mg/dL (ref 70–99)
Potassium: 3.6 mEq/L — ABNORMAL LOW (ref 3.7–5.3)
Sodium: 142 mEq/L (ref 137–147)

## 2014-03-25 LAB — SEDIMENTATION RATE: SED RATE: 0 mm/h (ref 0–22)

## 2014-03-25 MED ORDER — IOHEXOL 300 MG/ML  SOLN
150.0000 mL | Freq: Once | INTRAMUSCULAR | Status: AC | PRN
  Administered 2014-03-25: 60 mL via INTRAVENOUS

## 2014-03-25 MED ORDER — MIDAZOLAM HCL 2 MG/2ML IJ SOLN
INTRAMUSCULAR | Status: AC | PRN
  Administered 2014-03-25: 1 mg via INTRAVENOUS

## 2014-03-25 MED ORDER — FENTANYL CITRATE 0.05 MG/ML IJ SOLN
INTRAMUSCULAR | Status: AC | PRN
  Administered 2014-03-25: 25 ug via INTRAVENOUS

## 2014-03-25 MED ORDER — SODIUM CHLORIDE 0.9 % IV SOLN: INTRAVENOUS | Status: AC

## 2014-03-25 MED ORDER — MIDAZOLAM HCL 2 MG/2ML IJ SOLN
INTRAMUSCULAR | Status: AC
  Filled 2014-03-25: qty 2

## 2014-03-25 MED ORDER — PANTOPRAZOLE SODIUM 40 MG PO TBEC
40.0000 mg | DELAYED_RELEASE_TABLET | Freq: Every day | ORAL | Status: DC
  Administered 2014-03-26 – 2014-03-28 (×3): 40 mg via ORAL
  Filled 2014-03-25 (×3): qty 1

## 2014-03-25 MED ORDER — SODIUM CHLORIDE 0.9 % IV SOLN
INTRAVENOUS | Status: AC | PRN
  Administered 2014-03-25: 75 mL/h via INTRAVENOUS

## 2014-03-25 MED ORDER — FENTANYL CITRATE 0.05 MG/ML IJ SOLN
INTRAMUSCULAR | Status: AC
  Filled 2014-03-25: qty 2

## 2014-03-25 NOTE — Progress Notes (Signed)
Stroke Team Progress Note  HISTORY Regina [name] is a 48 y.o. female with a history of MVP who presents with left-sided weakness that started yesterday 5/2 around lunchtime. She states that she noticed that she was having difficulty folding clothes. Later that day when she lay down for a nap, she noticed some headache. This has since resolved. Today 03/23/2014, she stumbled and fell against a dishwasher and her child insisted that she come into the emergency room. CT demonstrated a right frontal lobar hemorrhage. Patient was not administered TPA secondary to hemorrahge. She was admitted to the neuro ICU for further evaluation and treatment. No prior history of DVT, pulmonary embolism, rash, joint problems. Denies history of drug abuse., Birth control pills,  SUBJECTIVE Patient just back from IR after having angio. Daughter at bedside.   OBJECTIVE Most recent Vital Signs: Filed Vitals:   03/25/14 0946 03/25/14 0951 03/25/14 1001 03/25/14 1016  BP: 143/80 135/78 143/78 148/76  Pulse: 69 70 64 68  Temp:      TempSrc:      Resp: _0 Height:      Weight:      SpO2: 99% 99% 96% 98%   CBG (last 3)  No results found for this basename: GLUCAP,  in the last 72 hours  IV Fluid Intake:   . sodium chloride 100 mL/hr at 03/24/14 1318  . sodium chloride      MEDICATIONS  . clindamycin  450 mg Oral 3 times per day  . fentaNYL      . midazolam      . pantoprazole (PROTONIX) IV  40 mg Intravenous QHS  . senna-docusate  1 tablet Oral BID   PRN:  acetaminophen, acetaminophen, labetalol  Diet:  NPO  Activity:  Bedrest post angio DVT Prophylaxis:  SCDs   CLINICALLY SIGNIFICANT STUDIES Basic Metabolic Panel:   Recent Labs Lab 03/23/14 2052 03/25/14 0550  NA 141 142  K 4.2 3.6*  CL 104 105  CO2 27 24  GLUCOSE 103* 84  BUN 17 8  CREATININE 0.58 0.58  CALCIUM 8.7 8.1*   Liver Function Tests:   Recent Labs Lab 03/23/14 2052  AST 15  ALT 11  ALKPHOS 71  BILITOT 0.4  PROT  6.4  ALBUMIN 3.6   CBC:   Recent Labs Lab 03/23/14 2052 03/25/14 0550  WBC 12.4* 11.4*  NEUTROABS 9.6*  --   HGB 18.0* 17.1*  HCT 53.6* 51.1*  MCV 96.6 97.9  PLT 584* 514*   Coagulation:   Recent Labs Lab 03/23/14 2052  LABPROT 13.6  INR 1.06   Cardiac Enzymes:   Recent Labs Lab 03/24/14 0800 03/24/14 1237 03/24/14 1958  TROPONINI <0.30 <0.30 <0.30   Urinalysis:   Recent Labs Lab 03/23/14 2252  COLORURINE YELLOW  LABSPEC 1.010  PHURINE 6.0  GLUCOSEU NEGATIVE  HGBUR MODERATE*  BILIRUBINUR NEGATIVE  KETONESUR NEGATIVE  PROTEINUR NEGATIVE  UROBILINOGEN 0.2  NITRITE NEGATIVE  LEUKOCYTESUR NEGATIVE   Lipid Panel No results found for this basename: chol,  trig,  hdl,  cholhdl,  vldl,  ldlcalc   HgbA1C  No results found for this basename: HGBA1C    Urine Drug Screen:     Component Value Date/Time   LABOPIA NONE DETECTED 03/23/2014 2252   COCAINSCRNUR NONE DETECTED 03/23/2014 2252   LABBENZ NONE DETECTED 03/23/2014 2252   AMPHETMU NONE DETECTED 03/23/2014 2252   THCU NONE DETECTED 03/23/2014 2252   LABBARB NONE DETECTED 03/23/2014 2252    Alcohol Level:  Recent Labs Lab 03/23/14 2052  ETH <11    CT of the brain  03/23/2014    There is a 2 x 3.4 cm acute right frontal intraparenchymal hematoma with mild surrounding edema. There is no mass effect, hydrocephalus or transtentorial herniation.   CT angio of the head 03/24/2014   Contracting right frontal 33 x 18 mm intraparenchymal hematoma, no new hemorrhage. Local mass effect without midline shift.  Normal CT angiogram of the head.     Cerebral angio head  03/25/2014  Normal per Dr. Clydene Fake reading; formal reading pending     MRI of the brain  03/24/2014   1. Stable appearance of a right frontal lobe parenchymal hemorrhage without evidence of an underlying etiology. Follow-up MRI of the brain at 2-3 months without and with contrast could be used for further evaluation after the acute blood products have resolved. 2.  Otherwise normal MRI appearance of the brain.  2D Echocardiogram  EF 50-55% with no source of embolus.   Renal ultrasound   TEE    CXR    EKG  normal sinus rhythm. For complete results please see formal report.   Therapy Recommendations   Physical Exam   Frail young Caucasian lady not in distress.Awake alert. Afebrile. Head is nontraumatic. Neck is supple without bruit. Hearing is normal. Cardiac exam no murmur or gallop. Lungs are clear to auscultation. Distal pulses are well felt. Neurological Exam : Awake alert oriented x 3 normal speech and language. Mild left lower face asymmetry. Tongue midline. No drift. Mild diminished fine finger movements on left. Orbits right over left upper extremity. Mild left grip weak.. Mild diminished left hemibody sensation . Normal coordination. Gait deferred.  ASSESSMENT Ms. Regina [name] is a 48 y.o. female presenting with left sided weakness. Imaging confirms a right frontal IPH with mild cytotoxic cerebral edema. Angio unrevealing for source. Hemorrhage etiology remains unclear. On no antithrombotics prior to admission. Patient with resultant left hemiparesis.    MVP  Hx 1 miscarriage at 12 weeks  Root canal in March, 2 weeks abx prior to procedure; has had 2 rounds abx post - clindamycin 450 3 times a day x3 days given to complete her previously scheduled course of antibiotics - need to obtain records to see if finishing the course is necessary  Denies supplemental intake, no caffeine, does not workout, has been under stress due to recent separation from husband (tearful).   Hypokalemia 3.6  Polycythemia - ? Etiology. Does have an association with renal cancer  Leukocytosis 12.4->11.4  Elevated Hgb 18->17.1  RBC 5.55->5.22  PLT 584->512   Hospital day # 2  TREATMENT/PLAN  Goal SBP < 180  Repeat CBC in am  Therapy evals after bedrest from angio completed  Ok to resume diet once able to sit up  F/u blood cultures x 2  ESR, ANA  and vasculitic panel, antiphospholipid panel  Follow up length of abx therapy  Burnetta Sabin, MSN, RN, ANVP-BC, AGPCNP-BC [facility] Pager: [phone] 03/25/2014 10:48 AM   I have personally obtained a history, examined the patient, evaluated imaging results, and formulated the assessment and plan of care. I agree with the above.  Antony Contras, MD  To contact Stroke Continuity provider, please refer to [email or web address] After hours, contact General Neurology

## 2014-03-25 NOTE — Sedation Documentation (Signed)
Pt assisted to IR table.  Left sided weakness, able to hold left arm up.  Pt states that that is better.  Left leg weak, unable to move self onto table.  Left grip weak.  Speech clear.  A&O X 3.  Dr Estanislado Pandy in to see pt.  Procedure explained. Questions answered.

## 2014-03-25 NOTE — Procedures (Signed)
S/P 4 vessel cerebral arteiogram  RT CFA approach. Findinggs. 1.No angio evidence of AV shunting,aneurysm,Davf or  Of a nidus. 2.Venous ourflow WNLs.. Relative prominence of distal Rt pericallosal artery?significance

## 2014-03-25 NOTE — Progress Notes (Signed)
Occupational Therapy Evaluation Patient Details Name: Regina [name] MRN: [id number] DOB: [birth year] Today's Date: 03/25/2014    History of Present Illness 48 y.o. female admitted to [facility] on 03/23/14 with left sided weakness and HA.  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  Pt also found to have HTN.  Pt s/p   Only significant PMHx is MVP.  Pt s/p cerebral angiogram on 03/25/14. CT demonstrated a right frontal lobar hemorrhage   Clinical Impression   PTA, pt independent with all ADL and mobility. Pt presents with  Generalized LUE weakness and decreased coordination, mild L inattention and mild decreased executive level cognitive functioning. REc initial 24/7 S after D/C with follow up with outpt OT. Discussed need to refrain from driving at this time. Will follow acutely to facilitate safe D/C home.    Follow Up Recommendations  Supervision/Assistance - 24 hour;Outpatient OT    Equipment Recommendations  3 in 1 bedside comode    Recommendations for Other Services  Speech Therapy for cognitive evaluation     Precautions / Restrictions Precautions Precautions: Fall Precaution Comments: L inattention      Mobility Bed Mobility                  Transfers Overall transfer level: Needs assistance Equipment used: 1 person hand held assist Transfers: Sit to/from Stand Sit to Stand: Min assist              Balance Overall balance assessment: Needs assistance Sitting-balance support: Feet supported Sitting balance-Leahy Scale: Good     Standing balance support: During functional activity;Single extremity supported Standing balance-Leahy Scale: Poor Standing balance comment: posterior sway with standing                            ADL Overall ADL's : Needs assistance/impaired Eating/Feeding: Set up   Grooming: Set up;Supervision/safety   Upper Body Bathing: Set up;Supervision/ safety   Lower Body Bathing: Minimal assistance;Sit to/from stand   Upper  Body Dressing : Minimal assistance;Sitting   Lower Body Dressing: Minimal assistance;Sit to/from stand   Toilet Transfer: Minimal Print production planner Details (indicate cue type and reason): HHA Toileting- Clothing Manipulation and Hygiene: Min guard       Functional mobility during ADLs: Minimal assistance (HHA. unsteady at times) General ADL Comments: decline in function     Vision Eye Alignment: Within Functional Limits Alignment/Gaze Preference: Within Defined Limits Ocular Range of Motion: Within Functional Limits Tracking/Visual Pursuits: Decreased smoothness of horizontal tracking;Decreased smoothness of vertical tracking Saccades: Additional head turns occurred during testing Convergence: Within functional limits     Additional Comments: More difficulty sustaining visual attention L field   Perception Perception Perception Tested?: No   Praxis Praxis Praxis tested?: Not tested    Pertinent Vitals/Pain VSS No c/o pain     Hand Dominance Right   Extremity/Trunk Assessment Upper Extremity Assessment Upper Extremity Assessment: LUE deficits/detail LUE Deficits / Details: isolated movemetns. genreralized weakness.  LUE Coordination: decreased fine motor;decreased gross motor   Lower Extremity Assessment Lower Extremity Assessment: LLE deficits/detail LLE Deficits / Details: per functional assessment, no buckling of leg.  Pt does feel that it fatigues faster than her right leg.  No signs of increased foot drag or lack of coordination.  LLE Sensation: decreased light touch (very mild defits.  )   Cervical / Trunk Assessment Cervical / Trunk Assessment: Normal   Communication Communication Communication: No difficulties   Cognition Arousal/Alertness: Awake/alert Behavior  During Therapy: WFL for tasks assessed/performed Overall Cognitive Status: Impaired/Different from baseline Area of Impairment: Attention;Awareness;Problem solving   Current Attention  Level: Selective       Awareness: Emergent Problem Solving: Slow processing General Comments: mild L inattention noted   General Comments       Exercises Exercises: Other exercises Other Exercises Other Exercises: encouraged funcitonal use L hand   Shoulder Instructions      Home Living Family/patient expects to be discharged to:: Private residence Living Arrangements: Spouse/significant other;Children [phone] y.o. children) Available Help at Discharge: Family;Available 24 hours/day Type of Home: House Home Access: Stairs to enter [employer] of Steps: 1 Entrance Stairs-Rails: None Home Layout: Two level Alternate Level Stairs-Number of Steps: flight Alternate Level Stairs-Rails: Left Bathroom Shower/Tub: Tub/shower unit         Home Equipment: Environmental consultant - 2 wheels;Walker - standard (can borrow a portable commode from a friend)   Additional Comments: Pt wants to get well enough to go hiking with her daughter who is home from college.   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    OT Diagnosis: Generalized weakness;Cognitive deficits;Disturbance of vision   OT Problem List: Decreased strength;Decreased activity tolerance;Impaired balance (sitting and/or standing);Impaired vision/perception;Decreased coordination;Decreased knowledge of precautions;Impaired UE functional use   OT Treatment/Interventions: Self-care/ADL training;Therapeutic exercise;Neuromuscular education;Therapeutic activities;Visual/perceptual remediation/compensation;Patient/family education;Balance training    OT Goals(Current goals can be found in the care plan section) Acute Rehab OT Goals Patient Stated Goal: to use my hand OT Goal Formulation: With patient Time For Goal Achievement: 04/08/14 Potential to Achieve Goals: Good  OT Frequency:  Min 3X/week   Barriers to D/C:            Co-evaluation              End of Session Nurse Communication: Mobility status  Activity Tolerance: Patient tolerated treatment well Patient left: in chair;with call bell/phone within reach;with family/visitor present   Time: 1520-1540 OT Time Calculation (min): 20 min Charges:  OT General Charges $OT Visit: 1 Procedure OT Evaluation $Initial OT Evaluation Tier I: 1 Procedure OT Treatments $Self Care/Home Management : 8-22 mins G-Codes:    Roney Jaffe Morna Flud 2014-04-16, 3:46 PM   Maurie Boettcher, OTR/L  [phone] 04-16-2014

## 2014-03-25 NOTE — Progress Notes (Signed)
IR called, when patient returns she is to be bedrest for 3 hours, and have neurovascular checks q15 min x4 then q30 x 2

## 2014-03-25 NOTE — Sedation Documentation (Signed)
O2 2l/[place] started 

## 2014-03-25 NOTE — Clinical Documentation Improvement (Signed)
Abnormal diagnostic findings (MRI scans, CT scans, etc.) are not coded and reported unless the physician indicates their clinical significance. If possible, please help by clarifying the diagnostic and/or clinical significance of the abnormal diagnostic study for this patient. Cerebral Edema   Cytotoxic Edema   Vasogenic Edema   Other brain herniation (please specify)    Other cause (please specify)    Unable to determine   Unknown  Supportive Information: Risk Factors: (As per notes)"stroke"  Diagnostics:(As per MRI 03-24-14) "There is some local mass effect with effacement of the sulci."  Thank you,  Alessandra Grout, RN, BSN, CCDS, Clinical Documentation Specialist:  [phone]   [phone]=[facility] [place]- Health Information Management

## 2014-03-25 NOTE — Progress Notes (Signed)
Asked to see for TEE- (stroke). Discussed risk and benefits of the procedure with the pt and she is agreeable to proceed. Scheduled for 10 with Dr Aundra Dubin.  Kerin Ransom PA-C 03/25/2014 3:33 PM

## 2014-03-25 NOTE — Sedation Documentation (Signed)
Exoseal closure by France Ravens, RT

## 2014-03-25 NOTE — Evaluation (Signed)
Speech Language Pathology Evaluation Patient Details Name: Grayson White MRN: [id number] DOB: 1966/09/08 Today's Date: 03/25/2014 Time: 9528-4132 SLP Time Calculation (min): 16 min  Problem List:  Patient Active Problem List   Diagnosis Date Noted  . Stroke 03/24/2014   Past Medical History:  Past Medical History  Diagnosis Date  . Mitral valve prolapse    Past Surgical History:  Past Surgical History  Procedure Laterality Date  . Root canal     HPI:  48 yo female adm to [facility] after tripping over dishwasher - found to have right frontal lobe hemmorhage.  SLP evaluation ordered.  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  SLE ordered.     Assessment / Plan / Recommendation Clinical Impression  Pt at baseline level of function currently without dysarthria, dysphasia or cognitive deficits.  Pt able to follow multiple step commands, name 17 animals in 60 seconds.  She demonstrated good judgement abilities by stating she was thankful her elderly couple she helps to care for were admitted to [facility] thus decreasing their dependency on her.  Discussion also took place re: cva symptoms and indication for immediate medical care.  No further SLP indicated as pt at baseline level of function.      SLP Assessment  Patient does not need any further Speech Lanaguage Pathology Services    Follow Up Recommendations  None    Frequency and Duration        Pertinent Vitals/Pain Afebrile, decreased    SLP Evaluation Prior Functioning  Cognitive/Linguistic Baseline: Within functional limits  Lives With: Spouse;Daughter;Son (spouse and 4 children) Available Help at Discharge: Family Vocation: Part time employment   Cognition  Arousal/Alertness: Awake/alert Orientation Level: Oriented X4 Attention: Sustained Sustained Attention: Appears intact Memory: Appears intact Awareness: Appears intact Problem Solving:  Appears intact    Comprehension  Auditory Comprehension Overall Auditory Comprehension: Appears within functional limits for tasks assessed Yes/No Questions: Not tested Commands: Within Functional Limits Conversation: Complex Visual Recognition/Discrimination Discrimination: Within Function Limits Reading Comprehension Reading Status: Within funtional limits    Expression Expression Primary Mode of Expression: Verbal Verbal Expression Overall Verbal Expression: Appears within functional limits for tasks assessed Initiation: No impairment Level of Generative/Spontaneous Verbalization: Conversation Naming: Not tested Pragmatics: No impairment Written Expression Dominant Hand: Right Written Expression: Not tested (pt with current HOB restrictions)   Oral / Motor Oral Motor/Sensory Function Overall Oral Motor/Sensory Function: Appears within functional limits for tasks assessed Motor Speech Overall Motor Speech: Appears within functional limits for tasks assessed   Register, [place] [facility] SLP [phone]

## 2014-03-25 NOTE — Sedation Documentation (Signed)
MD at bedside.  Explaining findings to pt and daughter.

## 2014-03-25 NOTE — Sedation Documentation (Signed)
O2 d/c'd 

## 2014-03-25 NOTE — Progress Notes (Signed)
HOB flat from 10:30-12:30, then HOB raised 30 degrees. Patient tolerating well. Patient still bedrest until 1:30

## 2014-03-25 NOTE — Evaluation (Signed)
Physical Therapy Evaluation Patient Details Name: Regina [name] MRN: [id number] DOB: 31-Oct-1966 Today's Date: 03/25/2014   History of Present Illness  48 y.o. female admitted to [facility] on 03/23/14 with left sided weakness and HA.  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 Pt s/p cerebral angiogram on 03/25/14.  Pt also found to have HTN.   Only significant PMHx is MVP.   Clinical Impression  Pt is moving well with signs of imbalance even holding the IV pole.  She will likely need a cane for stability with gait.  PT will follow acutely, but I believe she will progress well enough to go home with OP PT f/u at discharge.  She live near [facility] in [place] where she can get OP PT.      Follow Up Recommendations Outpatient PT;Supervision/Assistance - 24 hour (24/7 supervision for the first few days after d/c)    Equipment Recommendations  Other (comment) (may need a cane, to be further assessed)    Recommendations for Other Services   NA    Precautions / Restrictions Precautions Precautions: Fall Precaution Comments: L inattention (per OT assessment)      Mobility  Bed Mobility Overal bed mobility: Needs Assistance Bed Mobility: Supine to Sit     Supine to sit: Min assist     General bed mobility comments: Min assist to help support trunk for balance and progress left leg to EOB.   Transfers Overall transfer level: Needs assistance Equipment used: 1 person hand held assist (IV pole) Transfers: Sit to/from Stand Sit to Stand: Min assist         General transfer comment: Min assist to support trunk for balance during transitions.   Ambulation/Gait Ambulation/Gait assistance: Min guard Ambulation Distance (Feet): 100 Feet Assistive device:  (IVpole) Gait Pattern/deviations: Step-through pattern;Staggering left;Staggering right Gait velocity: decreased Gait velocity interpretation: Below normal speed for age/gender General Gait Details: Pt with slow, cautious gait  pattern, midlly staggering.  Min guard assist for balance and safety.  May need a cane for balance when I take the IV pole away next session.    Modified Rankin (Stroke Patients Only) Modified Rankin (Stroke Patients Only) Pre-Morbid Rankin Score: No symptoms Modified Rankin: Moderately severe disability     Balance Overall balance assessment: Needs assistance Sitting-balance support: Feet supported;No upper extremity supported Sitting balance-Leahy Scale: Good     Standing balance support: Single extremity supported Standing balance-Leahy Scale: Poor Standing balance comment: needs at least one extremity supported to maintain balance.                              Pertinent Vitals/Pain See vitals flow sheet.     Home Living Family/patient expects to be discharged to:: Private residence Living Arrangements: Spouse/significant other;Children [phone] y.o. children) Available Help at Discharge: Family;Available 24 hours/day Type of Home: House Home Access: Stairs to enter Entrance Stairs-Rails: None Entrance Stairs-Number of Steps: 1 Home Layout: Two level Home Equipment: Walker - 2 wheels;Walker - standard (can borrow a portable commode from a friend) Additional Comments: Pt wants to get well enough to go hiking with her daughter who is home from college.     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 genreralized weakness.    Lower Extremity Assessment: LLE deficits/detail   LLE Deficits / Details: per functional assessment, no buckling of leg.  Pt does feel that it fatigues faster than her right leg.  No signs of increased foot drag or lack of coordination.   Cervical / Trunk Assessment: Normal   Communication   Communication: No difficulties  Cognition Arousal/Alertness: Awake/alert Behavior During Therapy: WFL for tasks assessed/performed Overall Cognitive Status: Impaired/Different from baseline Area of Impairment: Problem solving   Current Attention Level: Selective       Awareness: Emergent Problem Solving: Slow processing General Comments: Pt slow to process information and reports she thinks her thinking feels slow.         Exercises Other Exercises Other Exercises: encouraged funcitonal use L hand      Assessment/Plan    PT Assessment Patient needs continued PT services  PT Diagnosis Difficulty walking;Abnormality of gait;Generalized weakness;Hemiplegia non-dominant side   PT Problem List Decreased strength;Decreased activity tolerance;Decreased balance;Decreased mobility;Decreased coordination;Decreased cognition;Decreased knowledge of use of DME;Impaired sensation  PT Treatment Interventions DME instruction;Gait training;Stair training;Therapeutic activities;Therapeutic exercise;Functional mobility training;Balance training;Neuromuscular re-education;Patient/family education;Cognitive remediation   PT Goals (Current goals can be found in the Care Plan section) Acute Rehab PT Goals Patient Stated Goal: to get back to normal so she can go hiking with her daughter PT Goal Formulation: With patient Time For Goal Achievement: 04/08/14 Potential to Achieve Goals: Good    Frequency Min 4X/week    End of Session Equipment Utilized During Treatment: Gait belt Activity Tolerance: Patient tolerated treatment well Patient left: in chair;with call bell/phone within reach;with family/visitor present           Time: 1450-1520 PT Time Calculation (min): 30 min   Charges:   PT Evaluation $Initial PT Evaluation Tier I: 1 Procedure PT Treatments $Gait Training: 8-22 mins        James Senn B. [place], [place], DPT [phone]   03/25/2014, 6:13 PM

## 2014-03-26 ENCOUNTER — Encounter (HOSPITAL_COMMUNITY): Payer: Self-pay | Admitting: Gastroenterology

## 2014-03-26 ENCOUNTER — Inpatient Hospital Stay (HOSPITAL_COMMUNITY): Payer: Medicaid Other

## 2014-03-26 ENCOUNTER — Encounter (HOSPITAL_COMMUNITY)
Admission: EM | Disposition: A | Discharge status: Home / Self Care | Payer: Self-pay | Source: Home / Self Care | Attending: Neurology

## 2014-03-26 DIAGNOSIS — I6789 Other cerebrovascular disease: Secondary | ICD-10-CM

## 2014-03-26 HISTORY — PX: TEE WITHOUT CARDIOVERSION: SHX5443

## 2014-03-26 LAB — CBC
HCT: 52.9 % — ABNORMAL HIGH (ref 36.0–46.0)
Hemoglobin: 17.6 g/dL — ABNORMAL HIGH (ref 12.0–15.0)
MCH: 32.5 pg (ref 26.0–34.0)
MCHC: 33.3 g/dL (ref 30.0–36.0)
MCV: 97.6 fL (ref 78.0–100.0)
PLATELETS: 553 10*3/uL — AB (ref 150–400)
RBC: 5.42 MIL/uL — AB (ref 3.87–5.11)
RDW: 13.6 % (ref 11.5–15.5)
WBC: 11.2 10*3/uL — ABNORMAL HIGH (ref 4.0–10.5)

## 2014-03-26 LAB — ANA: Anti Nuclear Antibody(ANA): NEGATIVE

## 2014-03-26 LAB — C4 COMPLEMENT: Complement C4, Body Fluid: 17 mg/dL (ref 10–40)

## 2014-03-26 LAB — C3 COMPLEMENT: C3 COMPLEMENT: 79 mg/dL — AB (ref 90–180)

## 2014-03-26 SURGERY — ECHOCARDIOGRAM, TRANSESOPHAGEAL
Anesthesia: Moderate Sedation

## 2014-03-26 MED ORDER — MIDAZOLAM HCL 5 MG/ML IJ SOLN
INTRAMUSCULAR | Status: AC
  Filled 2014-03-26: qty 2

## 2014-03-26 MED ORDER — MIDAZOLAM HCL 10 MG/2ML IJ SOLN
INTRAMUSCULAR | Status: DC | PRN
  Administered 2014-03-26: 2 mg via INTRAVENOUS
  Administered 2014-03-26: 1 mg via INTRAVENOUS
  Administered 2014-03-26: 2 mg via INTRAVENOUS

## 2014-03-26 MED ORDER — FENTANYL CITRATE 0.05 MG/ML IJ SOLN
INTRAMUSCULAR | Status: DC | PRN
  Administered 2014-03-26: 50 ug via INTRAVENOUS
  Administered 2014-03-26: 25 ug via INTRAVENOUS

## 2014-03-26 MED ORDER — FENTANYL CITRATE 0.05 MG/ML IJ SOLN
INTRAMUSCULAR | Status: AC
  Filled 2014-03-26: qty 2

## 2014-03-26 MED ORDER — BUTAMBEN-TETRACAINE-BENZOCAINE 2-2-14 % EX AERO
INHALATION_SPRAY | CUTANEOUS | Status: DC | PRN
  Administered 2014-03-26: 2 via TOPICAL

## 2014-03-26 MED ORDER — ALUM & MAG HYDROXIDE-SIMETH 200-200-20 MG/5ML PO SUSP
20.0000 mL | Freq: Once | ORAL | Status: AC
  Administered 2014-03-26: 20 mL via ORAL
  Filled 2014-03-26: qty 30

## 2014-03-26 NOTE — Progress Notes (Signed)
Physical Therapy Treatment Patient Details Name: Regina [name] MRN: [id number] DOB: 04-07-1966 Today's Date: 03/26/2014    History of Present Illness 48 y.o. female admitted to [facility] on 03/23/14 with left sided weakness and HA.  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 Pt s/p cerebral angiogram on 03/25/14.  Pt also found to have HTN.   Only significant PMHx is MVP.     PT Comments    Pt did well walking with cane today, still very slow gait speed, slower without the cane.  She did show signs of left inattention when using the left hand rail on the stairs and running into the left side of the door jam.  She continues to have slow processing and memory deficits. She will need a cane at d/c.    Follow Up Recommendations  Outpatient PT;Supervision/Assistance - 24 hour     Equipment Recommendations  Cane    Recommendations for Other Services   NA     Precautions / Restrictions Precautions Precautions: Fall Precaution Comments: L inattention    Mobility  Bed Mobility Overal bed mobility: Needs Assistance Bed Mobility: Supine to Sit   Sidelying to sit: Min assist       General bed mobility comments: Min assist to help support trunk and move left leg to EOB.    Transfers Overall transfer level: Needs assistance Equipment used: Straight cane Transfers: Sit to/from Stand Sit to Stand: Min guard         General transfer comment: min guard assist for safety and balance.   Ambulation/Gait Ambulation/Gait assistance: Min guard Ambulation Distance (Feet): 150 Feet Assistive device: Straight cane Gait Pattern/deviations: Step-through pattern Gait velocity: decreased Gait velocity interpretation: Below normal speed for age/gender General Gait Details: Pt continues to have slow gait pattern.  Min guard assist for safety as this is her first time using the cane.  When we tried to walk without the cane she got even slower in her gait speed.     Stairs Stairs: Yes Stairs  assistance: Min guard Stair Management: One rail Left;Step to pattern;Forwards Number of Stairs: 5 General stair comments: Pt's railing is on the left at home to get up to her bedroom.  We tried using her left hand while going up and down the stairs and she was able to use it, but would forget it and it would lag way behind her on the railing.  Verbal cues needed to attend to the hand.  Step to and reciprocal pattern leading with either leg was ok.  Leading with right stronger leg was better/more stable than leading with left leg.    Modified Rankin (Stroke Patients Only) Modified Rankin (Stroke Patients Only) Pre-Morbid Rankin Score: No symptoms Modified Rankin: Moderately severe disability     Balance Overall balance assessment: Needs assistance Sitting-balance support: Feet supported;No upper extremity supported Sitting balance-Leahy Scale: Good     Standing balance support: Single extremity supported Standing balance-Leahy Scale: Fair                      Cognition Arousal/Alertness: Awake/alert Behavior During Therapy: WFL for tasks assessed/performed Overall Cognitive Status: Impaired/Different from baseline Area of Impairment: Memory;Problem solving     Memory: Decreased short-term memory     Awareness: Emergent Problem Solving: Slow processing General Comments: Pt continues to be slow processing info, also noted STM deficits and more obvious left inattention today.             Pertinent Vitals/Pain See vitals flow  sheet.   BP within parameters set by MD.  118/78 seated.            PT Goals (current goals can now be found in the care plan section) Acute Rehab PT Goals Patient Stated Goal: to get back to normal so she can go hiking with her daughter Progress towards PT goals: Progressing toward goals    Frequency  Min 4X/week    PT Plan Current plan remains appropriate       End of Session   Activity Tolerance: Patient limited by  fatigue Patient left: in chair;with call bell/phone within reach;with chair alarm set     Time: 1451-1516 PT Time Calculation (min): 25 min  Charges:  $Gait Training: 8-22 mins $Therapeutic Activity: 8-22 mins                      Camani Sesay B. [place], [place], DPT [phone]   03/26/2014, 4:50 PM

## 2014-03-26 NOTE — CV Procedure (Signed)
Procedure: TEE  Indication: CVA  Sedation: Versed 5 mg IV, Fentanyl 75 mcg IV  Findings: Normal LV size and systolic function, EF 58%.  Normal RV size and systolic function.  No significant valvular abnormalities.  No LAA thrombus.  No PFO/ASD, negative bubble study.  No significant thoracic aortic plaque noted.   No source for embolus.   Larey Dresser 03/26/2014 10:46 AM

## 2014-03-26 NOTE — H&P (View-Only) (Signed)
Stroke Team Progress Note  HISTORY Regina [name] is a 48 y.o. female with a history of MVP who presents with left-sided weakness that started yesterday 5/2 around lunchtime. She states that she noticed that she was having difficulty folding clothes. Later that day when she lay down for a nap, she noticed some headache. This has since resolved. Today 03/23/2014, she stumbled and fell against a dishwasher and her child insisted that she come into the emergency room. CT demonstrated a right frontal lobar hemorrhage. Patient was not administered TPA secondary to hemorrahge. She was admitted to the neuro ICU for further evaluation and treatment. No prior history of DVT, pulmonary embolism, rash, joint problems. Denies history of drug abuse., Birth control pills,  SUBJECTIVE Patient just back from IR after having angio. Daughter at bedside.   OBJECTIVE Most recent Vital Signs: Filed Vitals:   03/25/14 0946 03/25/14 0951 03/25/14 1001 03/25/14 1016  BP: 143/80 135/78 143/78 148/76  Pulse: 69 70 64 68  Temp:      TempSrc:      Resp: _0 Height:      Weight:      SpO2: 99% 99% 96% 98%   CBG (last 3)  No results found for this basename: GLUCAP,  in the last 72 hours  IV Fluid Intake:   . sodium chloride 100 mL/hr at 03/24/14 1318  . sodium chloride      MEDICATIONS  . clindamycin  450 mg Oral 3 times per day  . fentaNYL      . midazolam      . pantoprazole (PROTONIX) IV  40 mg Intravenous QHS  . senna-docusate  1 tablet Oral BID   PRN:  acetaminophen, acetaminophen, labetalol  Diet:  NPO  Activity:  Bedrest post angio DVT Prophylaxis:  SCDs   CLINICALLY SIGNIFICANT STUDIES Basic Metabolic Panel:   Recent Labs Lab 03/23/14 2052 03/25/14 0550  NA 141 142  K 4.2 3.6*  CL 104 105  CO2 27 24  GLUCOSE 103* 84  BUN 17 8  CREATININE 0.58 0.58  CALCIUM 8.7 8.1*   Liver Function Tests:   Recent Labs Lab 03/23/14 2052  AST 15  ALT 11  ALKPHOS 71  BILITOT 0.4  PROT  6.4  ALBUMIN 3.6   CBC:   Recent Labs Lab 03/23/14 2052 03/25/14 0550  WBC 12.4* 11.4*  NEUTROABS 9.6*  --   HGB 18.0* 17.1*  HCT 53.6* 51.1*  MCV 96.6 97.9  PLT 584* 514*   Coagulation:   Recent Labs Lab 03/23/14 2052  LABPROT 13.6  INR 1.06   Cardiac Enzymes:   Recent Labs Lab 03/24/14 0800 03/24/14 1237 03/24/14 1958  TROPONINI <0.30 <0.30 <0.30   Urinalysis:   Recent Labs Lab 03/23/14 2252  COLORURINE YELLOW  LABSPEC 1.010  PHURINE 6.0  GLUCOSEU NEGATIVE  HGBUR MODERATE*  BILIRUBINUR NEGATIVE  KETONESUR NEGATIVE  PROTEINUR NEGATIVE  UROBILINOGEN 0.2  NITRITE NEGATIVE  LEUKOCYTESUR NEGATIVE   Lipid Panel No results found for this basename: chol,  trig,  hdl,  cholhdl,  vldl,  ldlcalc   HgbA1C  No results found for this basename: HGBA1C    Urine Drug Screen:     Component Value Date/Time   LABOPIA NONE DETECTED 03/23/2014 2252   COCAINSCRNUR NONE DETECTED 03/23/2014 2252   LABBENZ NONE DETECTED 03/23/2014 2252   AMPHETMU NONE DETECTED 03/23/2014 2252   THCU NONE DETECTED 03/23/2014 2252   LABBARB NONE DETECTED 03/23/2014 2252    Alcohol Level:  Recent Labs Lab 03/23/14 2052  ETH <11    CT of the brain  03/23/2014    There is a 2 x 3.4 cm acute right frontal intraparenchymal hematoma with mild surrounding edema. There is no mass effect, hydrocephalus or transtentorial herniation.   CT angio of the head 03/24/2014   Contracting right frontal 33 x 18 mm intraparenchymal hematoma, no new hemorrhage. Local mass effect without midline shift.  Normal CT angiogram of the head.     Cerebral angio head  03/25/2014  Normal per Dr. Clydene Fake reading; formal reading pending     MRI of the brain  03/24/2014   1. Stable appearance of a right frontal lobe parenchymal hemorrhage without evidence of an underlying etiology. Follow-up MRI of the brain at 2-3 months without and with contrast could be used for further evaluation after the acute blood products have resolved. 2.  Otherwise normal MRI appearance of the brain.  2D Echocardiogram  EF 50-55% with no source of embolus.   Renal ultrasound   TEE    CXR    EKG  normal sinus rhythm. For complete results please see formal report.   Therapy Recommendations   Physical Exam   Frail young Caucasian lady not in distress.Awake alert. Afebrile. Head is nontraumatic. Neck is supple without bruit. Hearing is normal. Cardiac exam no murmur or gallop. Lungs are clear to auscultation. Distal pulses are well felt. Neurological Exam : Awake alert oriented x 3 normal speech and language. Mild left lower face asymmetry. Tongue midline. No drift. Mild diminished fine finger movements on left. Orbits right over left upper extremity. Mild left grip weak.. Mild diminished left hemibody sensation . Normal coordination. Gait deferred.  ASSESSMENT Ms. Regina [name] is a 48 y.o. female presenting with left sided weakness. Imaging confirms a right frontal IPH with mild cytotoxic cerebral edema. Angio unrevealing for source. Hemorrhage etiology remains unclear. On no antithrombotics prior to admission. Patient with resultant left hemiparesis.    MVP  Hx 1 miscarriage at 12 weeks  Root canal in March, 2 weeks abx prior to procedure; has had 2 rounds abx post - clindamycin 450 3 times a day x3 days given to complete her previously scheduled course of antibiotics - need to obtain records to see if finishing the course is necessary  Denies supplemental intake, no caffeine, does not workout, has been under stress due to recent separation from husband (tearful).   Hypokalemia 3.6  Polycythemia - ? Etiology. Does have an association with renal cancer  Leukocytosis 12.4->11.4  Elevated Hgb 18->17.1  RBC 5.55->5.22  PLT 584->512   Hospital day # 2  TREATMENT/PLAN  Goal SBP < 180  Repeat CBC in am  Therapy evals after bedrest from angio completed  Ok to resume diet once able to sit up  F/u blood cultures x 2  ESR, ANA  and vasculitic panel, antiphospholipid panel  Follow up length of abx therapy  Burnetta Sabin, MSN, RN, ANVP-BC, AGPCNP-BC [facility] Pager: [phone] 03/25/2014 10:48 AM   I have personally obtained a history, examined the patient, evaluated imaging results, and formulated the assessment and plan of care. I agree with the above.  Antony Contras, MD  To contact Stroke Continuity provider, please refer to [email or web address] After hours, contact General Neurology

## 2014-03-26 NOTE — Progress Notes (Signed)
Echocardiogram Echocardiogram Transesophageal has been performed.  Regina [name] Regina [name] 03/26/2014, 12:20 PM

## 2014-03-26 NOTE — Interval H&P Note (Signed)
History and Physical Interval Note:  03/26/2014 10:25 AM  Regina [name]  has presented today for surgery, with the diagnosis of STOKE  The various methods of treatment have been discussed with the patient and family. After consideration of risks, benefits and other options for treatment, the patient has consented to  Procedure(s): TRANSESOPHAGEAL ECHOCARDIOGRAM (TEE) (N/A) as a surgical intervention .  The patient's history has been reviewed, patient examined, no change in status, stable for surgery.  I have reviewed the patient's chart and labs.  Questions were answered to the patient's satisfaction.     Ibtisam Benge Claris Gladden

## 2014-03-26 NOTE — Progress Notes (Signed)
Stroke Team Progress Note  HISTORY Regina [name] is a 48 y.o. female with a history of MVP who presents with left-sided weakness that started yesterday 5/2 around lunchtime. She states that she noticed that she was having difficulty folding clothes. Later that day when she lay down for a nap, she noticed some headache. This has since resolved. Today 03/23/2014, she stumbled and fell against a dishwasher and her child insisted that she come into the emergency room. CT demonstrated a right frontal lobar hemorrhage. Patient was not administered TPA secondary to hemorrahge. She was admitted to the neuro ICU for further evaluation and treatment. No prior history of DVT, pulmonary embolism, rash, joint problems. Denies history of drug abuse., Birth control pills,  SUBJECTIVE Patient back in room post TEE. Daughter at the bedside.  OBJECTIVE Most recent Vital Signs: Filed Vitals:   03/26/14 1053 03/26/14 1100 03/26/14 1115 03/26/14 1121  BP:  135/79 133/72 142/80  Pulse:  66 71 71  Temp: 98.2 F (36.8 C)     TempSrc: Oral     Resp:  17 14 17   Height:      Weight:      SpO2:  97% 96% 93%   CBG (last 3)  No results found for this basename: GLUCAP,  in the last 72 hours  IV Fluid Intake:   . sodium chloride 100 mL/hr at 03/26/14 1040    MEDICATIONS  . clindamycin  450 mg Oral 3 times per day  . pantoprazole  40 mg Oral Q1200  . senna-docusate  1 tablet Oral BID   PRN:  acetaminophen, acetaminophen, labetalol  Diet:  Cardiac  Activity:   DVT Prophylaxis:  SCDs   CLINICALLY SIGNIFICANT STUDIES Basic Metabolic Panel:   Recent Labs Lab 03/23/14 2052 03/25/14 0550  NA 141 142  K 4.2 3.6*  CL 104 105  CO2 27 24  GLUCOSE 103* 84  BUN 17 8  CREATININE 0.58 0.58  CALCIUM 8.7 8.1*   Liver Function Tests:   Recent Labs Lab 03/23/14 2052  AST 15  ALT 11  ALKPHOS 71  BILITOT 0.4  PROT 6.4  ALBUMIN 3.6   CBC:   Recent Labs Lab 03/23/14 2052 03/25/14 0550 03/26/14 0516   WBC 12.4* 11.4* 11.2*  NEUTROABS 9.6*  --   --   HGB 18.0* 17.1* 17.6*  HCT 53.6* 51.1* 52.9*  MCV 96.6 97.9 97.6  PLT 584* 514* 553*   Coagulation:   Recent Labs Lab 03/23/14 2052  LABPROT 13.6  INR 1.06   Cardiac Enzymes:   Recent Labs Lab 03/24/14 0800 03/24/14 1237 03/24/14 1958  TROPONINI <0.30 <0.30 <0.30   Urinalysis:   Recent Labs Lab 03/23/14 2252  COLORURINE YELLOW  LABSPEC 1.010  PHURINE 6.0  GLUCOSEU NEGATIVE  HGBUR MODERATE*  BILIRUBINUR NEGATIVE  KETONESUR NEGATIVE  PROTEINUR NEGATIVE  UROBILINOGEN 0.2  NITRITE NEGATIVE  LEUKOCYTESUR NEGATIVE   Lipid Panel No results found for this basename: chol,  trig,  hdl,  cholhdl,  vldl,  ldlcalc   HgbA1C  No results found for this basename: HGBA1C    Urine Drug Screen:     Component Value Date/Time   LABOPIA NONE DETECTED 03/23/2014 2252   COCAINSCRNUR NONE DETECTED 03/23/2014 2252   LABBENZ NONE DETECTED 03/23/2014 2252   AMPHETMU NONE DETECTED 03/23/2014 2252   THCU NONE DETECTED 03/23/2014 2252   LABBARB NONE DETECTED 03/23/2014 2252    Alcohol Level:   Recent Labs Lab 03/23/14 2052  ETH <11  CT of the brain  03/23/2014    There is a 2 x 3.4 cm acute right frontal intraparenchymal hematoma with mild surrounding edema. There is no mass effect, hydrocephalus or transtentorial herniation.   CT angio of the head 03/24/2014   Contracting right frontal 33 x 18 mm intraparenchymal hematoma, no new hemorrhage. Local mass effect without midline shift.  Normal CT angiogram of the head.     Cerebral angio head  03/25/2014  1.No angio evidence of AV shunting,aneurysm, Davf or Of a nidus. 2.Venous ourflow WNLs. Relative prominence of distal Rt pericallosal artery?significance  MRI of the brain  03/24/2014   1. Stable appearance of a right frontal lobe parenchymal hemorrhage without evidence of an underlying etiology. Follow-up MRI of the brain at 2-3 months without and with contrast could be used for further  evaluation after the acute blood products have resolved. 2. Otherwise normal MRI appearance of the brain.  2D Echocardiogram  EF 50-55% with no source of embolus.   Renal ultrasound   TEE  03/26/2014 No source of embolus. No vegetation.  EKG  normal sinus rhythm. For complete results please see formal report.   Therapy Recommendations OP OT, OP PT  Physical Exam   Frail young Caucasian lady not in distress.Awake alert. Afebrile. Head is nontraumatic. Neck is supple without bruit. Hearing is normal. Cardiac exam no murmur or gallop. Lungs are clear to auscultation. Distal pulses are well felt. Neurological Exam : Awake alert oriented x 3 normal speech and language. Mild left lower face asymmetry. Tongue midline. No drift. Mild diminished fine finger movements on left. Orbits right over left upper extremity. Mild left grip weak.. Mild diminished left hemibody sensation . Normal coordination. Gait deferred.  ASSESSMENT Ms. Regina [name] is a 48 y.o. female presenting with left sided weakness. Imaging confirms a right frontal IPH with mild cytotoxic cerebral edema. Angio unrevealing for source. Hemorrhage etiology remains unclear. On no antithrombotics prior to admission. Patient with resultant left hemiparesis.    MVP  Hx 1 miscarriage at 12 weeks  Root canal in March, 2 weeks abx prior to procedure; has had 2 rounds abx post - clindamycin 450 3 times a day x3 days given to complete her previously scheduled course of antibiotics - need to obtain records to see if finishing the course is necessary  Denies supplemental intake, no caffeine, does not workout, has been under stress due to recent separation from husband (tearful).   Hypokalemia 3.6  Polycythemia - ? Etiology. Does have an association with renal cancer  Leukocytosis 12.4->11.4  Elevated Hgb 18->17.1  RBC 5.55->5.22  PLT 584->512  Blood cultures negative  vasculitis panel thus far unrevealing   Hospital day #  3  TREATMENT/PLAN  Goal SBP < 180  OP PT and OT  F/u renal ultrasound  Discharge once ultrasound completed.  Burnetta Sabin, MSN, RN, ANVP-BC, AGPCNP-BC [facility] Pager: [phone] 03/26/2014 11:50 AM   I have personally obtained a history, examined the patient, evaluated imaging results, and formulated the assessment and plan of care. I agree with the above.  Jim Like, DO Neurology-Stroke [facility] Pager: [phone]   To contact Stroke Continuity provider, please refer to [email or web address] After hours, contact General Neurology

## 2014-03-27 ENCOUNTER — Inpatient Hospital Stay (HOSPITAL_COMMUNITY): Payer: Medicaid Other

## 2014-03-27 ENCOUNTER — Encounter (HOSPITAL_COMMUNITY): Payer: Self-pay | Admitting: Cardiology

## 2014-03-27 LAB — TROPONIN I
Troponin I: <0.3 ng/mL (ref <0.30)
Troponin I: <0.3 ng/mL (ref <0.30)
Troponin I: <0.3 ng/mL (ref <0.30)

## 2014-03-27 LAB — COMPLEMENT, TOTAL: Compl, Total (CH50): 43 U/mL (ref 31–60)

## 2014-03-27 MED ORDER — HYDROMORPHONE HCL PF 1 MG/ML IJ SOLN
0.5000 mg | INTRAMUSCULAR | Status: DC | PRN
  Administered 2014-03-27 (×2): 0.5 mg via INTRAVENOUS
  Filled 2014-03-27 (×2): qty 1

## 2014-03-27 MED ORDER — IOHEXOL 300 MG/ML  SOLN
100.0000 mL | Freq: Once | INTRAMUSCULAR | Status: AC | PRN
  Administered 2014-03-27: 100 mL via INTRAVENOUS

## 2014-03-27 NOTE — Progress Notes (Signed)
Physical Therapy Treatment Patient Details Name: Regina [name] MRN: [id number] DOB: [birth year] Today's Date: 03/27/2014    History of Present Illness 48 y.o. female admitted to [facility] on 03/23/14 with left sided weakness and HA.  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 Pt s/p cerebral angiogram on 03/25/14.  Pt also found to have HTN.   Only significant PMHx is MVP.     PT Comments    Pt is progressing well with her mobility.  She still would benefit from a cane to steady herself during gait, especially outdoors.  She continues to show signs of left sided inattention, but this is improving some with reinforcement.  She was able to walk mildly faster, but is still significantly slow for her age at this time.  PT will continue to follow acutely and plan to do the DGI with her tomorrow.    Follow Up Recommendations  Outpatient PT;Supervision/Assistance - 24 hour     Equipment Recommendations  Cane    Recommendations for Other Services   NA     Precautions / Restrictions Precautions Precautions: Fall Precaution Comments: L inattention and VERY slow gait speed.  Restrictions Weight Bearing Restrictions: No    Mobility  Bed Mobility Overal bed mobility: Modified Independent Bed Mobility: Supine to Sit     Supine to sit: Modified independent (Device/Increase time);HOB elevated     General bed mobility comments: Pt able to get to EOB without external therapist assist today.  HOB was elelvated ~45 degress and it continues to take her extra time to complete the task.   Transfers Overall transfer level: Needs assistance Equipment used: Straight cane Transfers: Sit to/from Stand Sit to Stand: Supervision         General transfer comment: Supervision for safety, especially her fist stand OOB she is a bit stiff and off balance, but was able to walk into the bathroom and manage her own toileting without therapist assit today.   Ambulation/Gait Ambulation/Gait assistance:  Supervision Ambulation Distance (Feet): 350 Feet Assistive device: Straight cane Gait Pattern/deviations: Step-through pattern (decreased left arm swing, very stiff) Gait velocity: decreased Gait velocity interpretation: Below normal speed for age/gender General Gait Details: Pt needs cues at first to relax her left arm during gait.  She holds it in flexion until her attention is brought to it.  Therapists also helped facilitate a more natural arm swing and she still needs cues to avoid door frames on the left side.     Stairs Stairs: Yes Stairs assistance: Min assist Stair Management: One rail Left;Step to pattern;Forwards;With cane Number of Stairs: 9 General stair comments: Pt's daughter present for stair education.  Pt continues to be much safer leading with her right stronger leg and continues to need cues to move her left hand up every step as it lags behind.  It did not matter so much which leg she led down the stairs with.        Modified Rankin (Stroke Patients Only) Modified Rankin (Stroke Patients Only) Pre-Morbid Rankin Score: No symptoms Modified Rankin: Moderately severe disability     Balance Overall balance assessment: Needs assistance Sitting-balance support: No upper extremity supported;Feet supported Sitting balance-Leahy Scale: Good     Standing balance support: No upper extremity supported Standing balance-Leahy Scale: Fair                      Cognition Arousal/Alertness: Awake/alert Behavior During Therapy: WFL for tasks assessed/performed Overall Cognitive Status: Impaired/Different from baseline Area of Impairment:  Memory;Problem solving   Current Attention Level: Selective Memory: Decreased short-term memory;Decreased recall of precautions     Awareness: Emergent Problem Solving: Slow processing;Difficulty sequencing;Requires verbal cues General Comments: Pt continues to need cues about her left hand, especially in the stairwell, but  she did remember towards the end of the session that she was supposed to relax her left hand and arm during gait.  Continues to be slow processing and also slow relaying information.      Exercises General Exercises - Upper Extremity Shoulder Flexion: Strengthening;5 reps;Seated;Theraband;Left Shoulder Extension: Strengthening;Left;Seated;Theraband;5 reps Shoulder ABduction: Strengthening;Seated;Left;5 reps;Theraband Elbow Flexion: Strengthening;Left;5 reps;Seated;Theraband Elbow Extension: Strengthening;Left;5 reps;Seated Digit Composite Flexion: Self ROM;Left;Seated;Squeeze ball        Pertinent Vitals/Pain See vitals flow sheet.            PT Goals (current goals can now be found in the care plan section) Acute Rehab PT Goals Patient Stated Goal: to get back to normal so she can go hiking with her daughter Progress towards PT goals: Progressing toward goals    Frequency  Min 4X/week    PT Plan Current plan remains appropriate       End of Session Equipment Utilized During Treatment: Gait belt Activity Tolerance: Patient limited by fatigue Patient left: in chair;with call bell/phone within reach;with family/visitor present     Time: 7867-6720 PT Time Calculation (min): 23 min  Charges:  $Gait Training: 23-37 mins            Allina Riches B. [place], [place], DPT [phone]   03/27/2014, 5:27 PM

## 2014-03-27 NOTE — Progress Notes (Signed)
Occupational Therapy Treatment Patient Details Name: Regina [name] MRN: [id number] DOB: 1966-06-05 Today's Date: 03/27/2014    History of present illness 48 y.o. female admitted to [facility] on 03/23/14 with left sided weakness and HA.  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 Pt s/p cerebral angiogram on 03/25/14.  Pt also found to have HTN.   Only significant PMHx is MVP.    OT comments  Pt making progress with functional goals and should continue with acute OT services to increase level of function and safety. Pt provided with L UE exercises with level 2 (orange) theraband and blue squeeze ball. Pt stood at sink for ADLs with toiletry items placed to her L side with min verbal cues to attend to L side to locate items for use  Follow Up Recommendations  Supervision/Assistance - 24 hour;Outpatient OT    Equipment Recommendations  3 in 1 bedside comode    Recommendations for Other Services      Precautions / Restrictions Precautions Precautions: Fall Precaution Comments: L inattention Restrictions Weight Bearing Restrictions: No       Mobility Bed Mobility               General bed mobility comments: pt up in recliner upon entering room  Transfers Overall transfer level: Needs assistance Equipment used: Straight cane Transfers: Sit to/from Stand Sit to Stand: Min guard         General transfer comment: min guard assist for safety and balance.     Balance Overall balance assessment: Needs assistance Sitting-balance support: No upper extremity supported;Feet supported Sitting balance-Leahy Scale: Good     Standing balance support: Single extremity supported;During functional activity Standing balance-Leahy Scale: Fair                     ADL       Grooming: Set up;Supervision/safety;Oral care;Wash/dry face;Wash/dry hands;Standing (toiletry items placed to pt's L side)       Lower Body Bathing: Minimal assistance;Sit to/from stand;Min guard   Upper Body  Dressing : Minimal assistance;Min guard;Standing       Toilet Transfer: Ambulation;Grab Corporate investment banker- Clothing Manipulation and Hygiene: Supervision/safety;Sit to/from stand       Functional mobility during ADLs: Min guard;Cane General ADL Comments: decreased L inattention during ADL tasks at sink, required min verbal cues to attend to L side and L UE      Vision  wears reading glasses                              Cognition   Behavior During Therapy: [facility] for tasks assessed/performed                  Problem Solving: Slow processing General Comments: decreased L inattention this session, only min cues required                   Exercises General Exercises - Upper Extremity Shoulder Flexion: Strengthening;5 reps;Seated;Theraband;Left Shoulder Extension: Strengthening;Left;Seated;Theraband;5 reps Shoulder ABduction: Strengthening;Seated;Left;5 reps;Theraband Elbow Flexion: Strengthening;Left;5 reps;Seated;Theraband Elbow Extension: Strengthening;Left;5 reps;Seated Digit Composite Flexion: Self ROM;Left;Seated;Squeeze ball          General Comments  Pt very pleasant and cooperative    Pertinent Vitals/ Pain       No c/o pain, VSS  Prior Functioning/Environment  independent           Frequency Min 3X/week     Progress Toward Goals  OT Goals(current goals can now be found in the care plan section)  Progress towards OT goals: Progressing toward goals     Plan Discharge plan remains appropriate                     End of Session Equipment Utilized During Treatment: Gait belt   Activity Tolerance Patient tolerated treatment well   Patient Left in chair;with call bell/phone within reach;with family/visitor present;with chair alarm set             Time: 1601-0932 OT Time Calculation (min): 26 min  Charges: OT General Charges $OT Visit:  1 Procedure OT Treatments $Self Care/Home Management : 8-22 mins $Therapeutic Exercise: 8-22 mins  Mosetta Putt 03/27/2014, 2:01 PM

## 2014-03-27 NOTE — Progress Notes (Signed)
Stroke Team Progress Note  HISTORY Regina [name] is a 48 y.o. female with a history of MVP who presents with left-sided weakness that started yesterday 5/2 around lunchtime. She states that she noticed that she was having difficulty folding clothes. Later that day when she lay down for a nap, she noticed some headache. This has since resolved. Today 03/23/2014, she stumbled and fell against a dishwasher and her child insisted that she come into the emergency room. CT demonstrated a right frontal lobar hemorrhage. Patient was not administered TPA secondary to hemorrahge. She was admitted to the neuro ICU for further evaluation and treatment. No prior history of DVT, pulmonary embolism, rash, joint problems. Denies history of drug abuse., Birth control pills,  SUBJECTIVE   OBJECTIVE Most recent Vital Signs: Filed Vitals:   03/27/14 0128 03/27/14 0511 03/27/14 1057 03/27/14 1351  BP: 145/69 136/71 116/61 124/61  Pulse: 68 62 72 74  Temp: 98.4 F (36.9 C) 97.2 F (36.2 C) 98 F (36.7 C) 98.3 F (36.8 C)  TempSrc: Oral Oral Oral Oral  Resp: 16 16 16 16   Height:      Weight:      SpO2: 98% 85% 97% 99%   CBG (last 3)  No results found for this basename: GLUCAP,  in the last 72 hours  IV Fluid Intake:   . sodium chloride 100 mL/hr at 03/27/14 0542    MEDICATIONS  . pantoprazole  40 mg Oral Q1200  . senna-docusate  1 tablet Oral BID   PRN:  acetaminophen, acetaminophen, HYDROmorphone (DILAUDID) injection, labetalol  Diet:  Cardiac  Activity:   DVT Prophylaxis:  SCDs   CLINICALLY SIGNIFICANT STUDIES Basic Metabolic Panel:   Recent Labs Lab 03/23/14 2052 03/25/14 0550  NA 141 142  K 4.2 3.6*  CL 104 105  CO2 27 24  GLUCOSE 103* 84  BUN 17 8  CREATININE 0.58 0.58  CALCIUM 8.7 8.1*   Liver Function Tests:   Recent Labs Lab 03/23/14 2052  AST 15  ALT 11  ALKPHOS 71  BILITOT 0.4  PROT 6.4  ALBUMIN 3.6   CBC:   Recent Labs Lab 03/23/14 2052 03/25/14 0550  03/26/14 0516  WBC 12.4* 11.4* 11.2*  NEUTROABS 9.6*  --   --   HGB 18.0* 17.1* 17.6*  HCT 53.6* 51.1* 52.9*  MCV 96.6 97.9 97.6  PLT 584* 514* 553*   Coagulation:   Recent Labs Lab 03/23/14 2052  LABPROT 13.6  INR 1.06   Cardiac Enzymes:   Recent Labs Lab 03/24/14 1958 03/27/14 0439 03/27/14 1005  TROPONINI <0.30 <0.30 <0.30   Urinalysis:   Recent Labs Lab 03/23/14 2252  COLORURINE YELLOW  LABSPEC 1.010  PHURINE 6.0  GLUCOSEU NEGATIVE  HGBUR MODERATE*  BILIRUBINUR NEGATIVE  KETONESUR NEGATIVE  PROTEINUR NEGATIVE  UROBILINOGEN 0.2  NITRITE NEGATIVE  LEUKOCYTESUR NEGATIVE   Lipid Panel No results found for this basename: chol,  trig,  hdl,  cholhdl,  vldl,  ldlcalc   HgbA1C  No results found for this basename: HGBA1C    Urine Drug Screen:     Component Value Date/Time   LABOPIA NONE DETECTED 03/23/2014 2252   COCAINSCRNUR NONE DETECTED 03/23/2014 2252   LABBENZ NONE DETECTED 03/23/2014 2252   AMPHETMU NONE DETECTED 03/23/2014 2252   THCU NONE DETECTED 03/23/2014 2252   LABBARB NONE DETECTED 03/23/2014 2252    Alcohol Level:   Recent Labs Lab 03/23/14 2052  ETH <11    CT of the brain  03/23/2014  There is a 2 x 3.4 cm acute right frontal intraparenchymal hematoma with mild surrounding edema. There is no mass effect, hydrocephalus or transtentorial herniation.   CT angio of the head 03/24/2014   Contracting right frontal 33 x 18 mm intraparenchymal hematoma, no new hemorrhage. Local mass effect without midline shift.  Normal CT angiogram of the head.     Cerebral angio head  03/25/2014  1.No angio evidence of AV shunting,aneurysm, Davf or Of a nidus. 2.Venous ourflow WNLs. Relative prominence of distal Rt pericallosal artery?significance  MRI of the brain  03/24/2014   1. Stable appearance of a right frontal lobe parenchymal hemorrhage without evidence of an underlying etiology. Follow-up MRI of the brain at 2-3 months without and with contrast could be used for  further evaluation after the acute blood products have resolved. 2. Otherwise normal MRI appearance of the brain.  2D Echocardiogram  EF 50-55% with no source of embolus.   Renal ultrasound Indeterminate hypoechoic mass left adnexa further evaluation with dedicated pelvic ultrasound recommended. Otherwise unremarkable renal ultrasound  TEE  03/26/2014 No source of embolus. No vegetation.  EKG  normal sinus rhythm. For complete results please see formal report.   Therapy Recommendations OP OT, OP PT  Physical Exam   Filed Vitals:   03/27/14 0511 03/27/14 1057 03/27/14 1351 03/27/14 1911  BP: 136/71 116/61 124/61 134/65  Pulse: 62 72 74 80  Temp: 97.2 F (36.2 C) 98 F (36.7 C) 98.3 F (36.8 C) 98.4 F (36.9 C)  TempSrc: Oral Oral Oral Oral  Resp: 16 16 16 16   Height:      Weight:      SpO2: 85% 97% 99% 99%    GENERAL EXAM: MALAISE APPEARANCE, SOFT SPOKEN.   CARDIOVASCULAR: Regular rate and rhythm, no murmurs, no carotid bruits  NEUROLOGIC: MENTAL STATUS: awake, alert, language fluent, comprehension intact, naming intact, fund of knowledge appropriate CRANIAL NERVE: pupils equal and reactive to light, visual fields full to confrontation, extraocular muscles intact, no nystagmus, facial sensation symmetric, SLIGHTLY DECR LEFT NL FOLD, hearing intact, palate elevates symmetrically, uvula midline, shoulder shrug symmetric, tongue midline. MOTOR: normal bulk and tone, full strength in the RUE, RLE; LUE 4+, LLE 4 SENSORY: normal and symmetric to light touch, pinprick, temperature, vibration and proprioception COORDINATION: finger-nose-finger, fine finger movements SLOW IN LUE  REFLEXES: deep tendon reflexes present and symmetric GAIT/STATION:  IN BED   ASSESSMENT Regina [name] is a 48 y.o. female presenting with left sided weakness. Imaging confirms a right frontal IPH with mild cytotoxic cerebral edema. Angio unrevealing for source. Hemorrhage etiology remains unclear. On no  antithrombotics prior to admission. Patient with resultant left hemiparesis.    MVP  Hx 1 miscarriage at 12 weeks  Root canal in March, 2 weeks abx prior to procedure; has had 2 rounds abx post - clindamycin 450 3 times a day x3 days given to complete her previously scheduled course of antibiotics   depression  Denies supplemental intake, no caffeine, does not workout, has been under stress due to recent separation from husband (tearful).   Hypokalemia 3.6  Polycythemia - ? Etiology.   Leukocytosis 12.4->11.4  Elevated Hgb 18->17.1  RBC 5.55->5.22  PLT 584->512  Blood cultures negative  vasculitis panel thus far unrevealing   Possible Left adnexal mass seen on ultrasound   Hospital day # 4  TREATMENT/PLAN  Pelvic ultrasound, CT chest/abd/pelvis  OP PT and OT  Anticipate d/c in am  Burnetta Sabin, MSN, RN, ANVP-BC, AGPCNP-BC [facility]  [facility] Pager: [phone] 03/27/2014 4:27 PM  I evaluated and examined patient, reviewed records, labs and imaging, and agree with note and plan. Unclear etiology of right brain hemorrhage. Need to consider neoplastic causes. Will pursue CT chest, abd, pelvis for primary malignancy screening and eval left adnexal mass.    Penni Bombard, MD 01/22/3544, 6:25 PM Certified in Neurology, Neurophysiology and Neuroimaging [facility] - Stroke Team  Please refer to [email or web address] for on-call Stroke MD   To contact Stroke Continuity provider, please refer to [email or web address] After hours, contact General Neurology

## 2014-03-28 MED ORDER — POTASSIUM CHLORIDE CRYS ER 20 MEQ PO TBCR
20.0000 meq | EXTENDED_RELEASE_TABLET | Freq: Once | ORAL | Status: AC
  Administered 2014-03-28: 20 meq via ORAL
  Filled 2014-03-28: qty 1

## 2014-03-28 NOTE — Progress Notes (Signed)
Physical Therapy Treatment Patient Details Name: Regina [name] MRN: [id number] DOB: 1966/08/16 Today's Date: 03/28/2014    History of Present Illness 48 y.o. female admitted to [facility] on 03/23/14 with left sided weakness and HA.  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 Pt s/p cerebral angiogram on 03/25/14.  Pt also found to have HTN.   Only significant PMHx is MVP. CT scan and US show suspicious mass on kidney and possibly ovary.      PT Comments    Pt is progressing well, really did not need the cane for balance during gait today.  Pt continues to have a very slow gait speed, which I cannot get her to speed up while walking.  DGI indicates fall risk, but she did well with static balance exercises.  I continue to recommend OP PT at d/c, but she no longer needs the cane.  PT will continue to follow acutely until d/c.   Follow Up Recommendations  Outpatient PT;Supervision/Assistance - 24 hour     Equipment Recommendations  None recommended by PT    Recommendations for Other Services   NA       Precautions / Restrictions Precautions Precautions: Fall Precaution Comments: L inattention and VERY slow gait speed.  Restrictions Weight Bearing Restrictions: No    Mobility  Bed Mobility Overal bed mobility: Modified Independent             General bed mobility comments: Extra time needed to get EOB, but able to do so without therapist's assist.   Transfers Overall transfer level: Needs assistance Equipment used: None Transfers: Sit to/from Stand Sit to Stand: Supervision         General transfer comment: supervision for safety as pt is pretty stiff when she initially gets up to standing.   Ambulation/Gait Ambulation/Gait assistance: Supervision Ambulation Distance (Feet): 350 Feet Assistive device: None Gait Pattern/deviations: Step-through pattern (stiff trunk legs and arm. ) Gait velocity: decreased Gait velocity interpretation: Below normal speed for age/gender General  Gait Details: Pt continues to have significantly slow gait speed with decreased natural trunk rotation and decreased reciprocal arm swing.  Pt does stil tend to run into doorways when truning with her left side.  She self cued to relax her left arm down at her side.    Stairs Stairs: Yes Stairs assistance: Supervision Stair Management: No rails;Forwards;Step to pattern Number of Stairs: 5 General stair comments: supervision for safety as we were trying without upper extremity support for the DGI  Wheelchair Mobility    Modified Rankin (Stroke Patients Only) Modified Rankin (Stroke Patients Only) Pre-Morbid Rankin Score: No symptoms Modified Rankin: Moderately severe disability     Balance Overall balance assessment: Needs assistance Sitting-balance support: Feet supported;No upper extremity supported Sitting balance-Leahy Scale: Good     Standing balance support: No upper extremity supported Standing balance-Leahy Scale: Fair   Single Leg Stance - Right Leg: 10 Single Leg Stance - Left Leg: 10 Tandem Stance - Right Leg: 30 Tandem Stance - Left Leg: 30            Cognition Arousal/Alertness: Awake/alert Behavior During Therapy: WFL for tasks assessed/performed Overall Cognitive Status: Impaired/Different from baseline Area of Impairment: Memory   Current Attention Level: Selective Memory: Decreased short-term memory     Awareness: Emergent Problem Solving: Slow processing;Difficulty sequencing;Requires verbal cues General Comments: Continued slow processing and slow gait.  Some good carryover to remember to try to relax her left arm during gait, but continues to run into door jam  with left shoudler.            Pertinent Vitals/Pain See vitals flow sheet.      PT Goals (current goals can now be found in the care plan section) Acute Rehab PT Goals Patient Stated Goal: to go home today if able Progress towards PT goals: Progressing toward goals     Frequency  Min 4X/week    PT Plan Equipment recommendations need to be updated       End of Session Equipment Utilized During Treatment: Gait belt Activity Tolerance: Patient tolerated treatment well Patient left: in chair;with call bell/phone within reach;with family/visitor present     Time: 9242-6834 PT Time Calculation (min): 24 min  Charges:  $Gait Training: 23-37 mins                      Demetrion Wesby B. [place], [place], DPT [phone]   03/28/2014, 3:40 PM

## 2014-03-28 NOTE — Progress Notes (Signed)
Stroke Team Progress Note  HISTORY Regina [name] is a 48 y.o. female with a history of MVP who presents with left-sided weakness that started yesterday 5/2 around lunchtime. She states that she noticed that she was having difficulty folding clothes. Later that day when she lay down for a nap, she noticed some headache. This has since resolved. Today 03/23/2014, she stumbled and fell against a dishwasher and her child insisted that she come into the emergency room. CT demonstrated a right frontal lobar hemorrhage. Patient was not administered TPA secondary to hemorrahge. She was admitted to the neuro ICU for further evaluation and treatment. No prior history of DVT, pulmonary embolism, rash, joint problems. Denies history of drug abuse or birth control pills.  SUBJECTIVE Patient's daughter at the bedside. The patient feels she is doing better. She does note occasional twitching of her eyes. Dr. Leta Baptist discussed discharge and followup plans.  OBJECTIVE Most recent Vital Signs: Filed Vitals:   03/27/14 1911 03/27/14 2120 03/28/14 0120 03/28/14 0553  BP: 134/65 110/60 115/67 125/60  Pulse: 80 83 66 63  Temp: 98.4 F (36.9 C) 98.2 F (36.8 C) 98.3 F (36.8 C) 98.1 F (36.7 C)  TempSrc: Oral Oral Oral Oral  Resp: 16 18 18 20   Height:      Weight:      SpO2: 99% 98% 96% 99%   CBG (last 3)  No results found for this basename: GLUCAP,  in the last 72 hours  IV Fluid Intake:   . sodium chloride 100 mL/hr at 03/27/14 0542    MEDICATIONS  . pantoprazole  40 mg Oral Q1200  . senna-docusate  1 tablet Oral BID   PRN:  acetaminophen, acetaminophen, HYDROmorphone (DILAUDID) injection, labetalol  Diet:  Cardiac  Activity:   DVT Prophylaxis:  SCDs   CLINICALLY SIGNIFICANT STUDIES Basic Metabolic Panel:   Recent Labs Lab 03/23/14 2052 03/25/14 0550  NA 141 142  K 4.2 3.6*  CL 104 105  CO2 27 24  GLUCOSE 103* 84  BUN 17 8  CREATININE 0.58 0.58  CALCIUM 8.7 8.1*   Liver Function  Tests:   Recent Labs Lab 03/23/14 2052  AST 15  ALT 11  ALKPHOS 71  BILITOT 0.4  PROT 6.4  ALBUMIN 3.6   CBC:   Recent Labs Lab 03/23/14 2052 03/25/14 0550 03/26/14 0516  WBC 12.4* 11.4* 11.2*  NEUTROABS 9.6*  --   --   HGB 18.0* 17.1* 17.6*  HCT 53.6* 51.1* 52.9*  MCV 96.6 97.9 97.6  PLT 584* 514* 553*   Coagulation:   Recent Labs Lab 03/23/14 2052  LABPROT 13.6  INR 1.06   Cardiac Enzymes:   Recent Labs Lab 03/27/14 0439 03/27/14 1005 03/27/14 1702  TROPONINI <0.30 <0.30 <0.30   Urinalysis:   Recent Labs Lab 03/23/14 2252  COLORURINE YELLOW  LABSPEC 1.010  PHURINE 6.0  GLUCOSEU NEGATIVE  HGBUR MODERATE*  BILIRUBINUR NEGATIVE  KETONESUR NEGATIVE  PROTEINUR NEGATIVE  UROBILINOGEN 0.2  NITRITE NEGATIVE  LEUKOCYTESUR NEGATIVE   Lipid Panel No results found for this basename: chol,  trig,  hdl,  cholhdl,  vldl,  ldlcalc   HgbA1C  No results found for this basename: HGBA1C    Urine Drug Screen:     Component Value Date/Time   LABOPIA NONE DETECTED 03/23/2014 2252   COCAINSCRNUR NONE DETECTED 03/23/2014 2252   LABBENZ NONE DETECTED 03/23/2014 2252   AMPHETMU NONE DETECTED 03/23/2014 2252   THCU NONE DETECTED 03/23/2014 2252   LABBARB NONE DETECTED 03/23/2014  2252    Alcohol Level:   Recent Labs Lab 03/23/14 2052  ETH <11    CT of the brain  03/23/2014    There is a 2 x 3.4 cm acute right frontal intraparenchymal hematoma with mild surrounding edema. There is no mass effect, hydrocephalus or transtentorial herniation.   CT angio of the head 03/24/2014   Contracting right frontal 33 x 18 mm intraparenchymal hematoma, no new hemorrhage. Local mass effect without midline shift.  Normal CT angiogram of the head.     Cerebral angio head  03/25/2014  1.No angio evidence of AV shunting,aneurysm, Davf or Of a nidus. 2.Venous ourflow WNLs. Relative prominence of distal Rt pericallosal artery?significance  CT of the chest abdomen and pelvis 03/27/2014 1. No  suspected intrathoracic or intra-abdominal malignancy as a cause of intracranial hemorrhage. 2. 15 mm nodule in the lingula is most likely inflammatory given surrounding airspace disease. If a mass is discovered on surveillance brain imaging, repeat chest CT recommended. A followup chest x-ray in 6 to 8 weeks would also be prudent. 3. Bilateral adnexal masses, likely ovarian, measuring up to the 6 cm on the left. 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 Recommend dedicated pelvic ultrasound.   MRI of the brain  03/24/2014   1. Stable appearance of a right frontal lobe parenchymal hemorrhage without evidence of an underlying etiology. Follow-up MRI of the brain at 2-3 months without and with contrast could be used for further evaluation after the acute blood products have resolved. 2. Otherwise normal MRI appearance of the brain.  2D Echocardiogram  EF 50-55% with no source of embolus.   Renal ultrasound   Indeterminate hypoechoic mass left adnexa further evaluation with dedicated pelvic ultrasound recommended. Otherwise unremarkable renal ultrasound  TEE  03/26/2014 No source of embolus. No vegetation.  EKG  normal sinus rhythm. For complete results please see formal report.   Therapy Recommendations OP OT, OP PT  Physical Exam   Filed Vitals:   03/27/14 1911 03/27/14 2120 03/28/14 0120 03/28/14 0553  BP: 134/65 110/60 115/67 125/60  Pulse: 80 83 66 63  Temp: 98.4 F (36.9 C) 98.2 F (36.8 C) 98.3 F (36.8 C) 98.1 F (36.7 C)  TempSrc: Oral Oral Oral Oral  Resp: 16 18 18 20   Height:      Weight:      SpO2: 99% 98% 96% 99%    GENERAL EXAM: MALAISE APPEARANCE, SOFT SPOKEN.   CARDIOVASCULAR: Regular rate and rhythm, no murmurs, no carotid bruits  NEUROLOGIC: MENTAL STATUS: awake, alert, language fluent, comprehension intact, naming intact, fund of knowledge appropriate CRANIAL NERVE: pupils equal and reactive to light, visual fields full to confrontation,  extraocular muscles intact, no nystagmus, facial sensation symmetric, SLIGHTLY DECR LEFT NL FOLD, hearing intact, palate elevates symmetrically, uvula midline, shoulder shrug symmetric, tongue midline. MOTOR: normal bulk and tone, full strength in the RUE, RLE; LUE 4, LLE 4 SENSORY: normal and symmetric to light touch, pinprick, temperature, vibration and proprioception COORDINATION: finger-nose-finger, fine finger movements SLOW IN LUE  REFLEXES: deep tendon reflexes present and symmetric GAIT/STATION:  IN BED   ASSESSMENT Ms. Hanin Decook is a 48 y.o. female presenting with left sided weakness. Imaging confirms a right frontal IPH with mild cytotoxic cerebral edema. Angio unrevealing for source. Hemorrhage etiology remains unclear. On no antithrombotics prior to admission. Patient with resultant left hemiparesis.    MVP  Hx 1 miscarriage at 12 weeks  Root canal in March, 2 weeks abx prior to  procedure; has had 2 rounds abx post - clindamycin 450 3 times a day x3 days given to complete her previously scheduled course of antibiotics   depression  Denies supplemental intake, no caffeine, does not workout, has been under stress due to recent separation from husband (tearful).   Hypokalemia 3.6  Polycythemia - ? Etiology.   Leukocytosis 12.4->11.4  Elevated Hgb 18->17.1  RBC 5.55->5.22  PLT 584->512  Blood cultures negative  vasculitis panel thus far unrevealing  Possible Left adnexal mass seen on ultrasound   Hospital day # 5  TREATMENT/PLAN  OP PT and OT  Spoke with Dr Kennon Rounds - OB / Gyn to arrange F/U of adnexal mass.   Followup Dr. Leonie Man - follow up brain imaging and followup chest x-ray in 6-8 weeks to evaluate nodule.  Mikey Bussing PA-C [facility] Pager [phone] 03/28/2014, 9:33 AM   I evaluated and examined patient, reviewed records, labs and imaging, and agree with note and plan. Here with right frontal ICH (unknown etiology) and now found  to have bilateral adnexal (possibly ovarian) masses, left lung nodule. Neurologically stable. Will arrange outpatient followup of lung, ovary and brain abnormalities. Patient and daughter agree with plan.   Penni Bombard, MD 1/0/2725, 3:66 PM Certified in Neurology, Neurophysiology and Neuroimaging [facility] - Stroke Team  Please refer to [email or web address] for on-call Stroke MD     To contact Stroke Continuity provider, please refer to [email or web address] After hours, contact General Neurology

## 2014-03-28 NOTE — Care Management Note (Signed)
    Page 1 of 1   03/28/2014     10:41:03 AM CARE MANAGEMENT NOTE 03/28/2014  Patient:  Regina [name], Regina [name]   Account Number:  [id number]  Date Initiated:  03/28/2014  Documentation initiated by:  Lorne Skeens  Subjective/Objective Assessment:   Patient admitted for CVA. Lives at home.  Daughter is currently home from college, and will be providing support and transportation to appointments     Action/Plan:   Will follow for discharge needs pending PT/OT evals and physician orders   Anticipated DC Date:  03/28/2014   Anticipated DC Plan:  [place]  CM consult  [facility]  OP Neuro Rehab      Choice offered to / List presented to:  C-1 Patient           Status of service:  Completed, signed off Medicare Important Message given?   (If response is "NO", the following Medicare IM given date fields will be blank) Date Medicare IM given:   Date Additional Medicare IM given:    Discharge Disposition:    Per UR Regulation:  Reviewed for med. necessity/level of care/duration of stay  If discussed at [place] of Stay Meetings, dates discussed:    Comments:  03/28/14 [place], MSN, CM- Met with patient to discuss discharge  planning.  Patient is agreeable to outpatient PT/OT and has chosen [facility], as she lives near the hospital.  CM spoke with [name] at [facility], who schedule patient for 04/01/14 at [place]. This information was passed along to patient and added to the AVS.  Patient currently does not have a PCP and is being assisted with her Medicaid application by Financial Counseling.  Patient has been following with the [facility] Dept an will continue to do so until she has obtained Medicaid/insurance.  Patient was instructed to make a hospital follow-up appointment upon discharge. Patient and daughter both verbalized understanding.

## 2014-03-28 NOTE — Progress Notes (Signed)
Occupational Therapy Treatment Patient Details Name: Regina [name] MRN: [id number] DOB: 06/16/1966 Today's Date: 03/28/2014    History of present illness 48 y.o. female admitted to [facility] on 03/23/14 with left sided weakness and HA.  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 Pt s/p cerebral angiogram on 03/25/14.  Pt also found to have HTN.   Only significant PMHx is MVP.    OT comments  Pt continues to slowly improve with adls. Spoke with pt and daughter about the L neglect issues as well as making sure that pt has 24 hour S at home and does not drive at this time.  Follow Up Recommendations  Supervision/Assistance - 24 hour;Outpatient OT    Equipment Recommendations  3 in 1 bedside comode    Recommendations for Other Services      Precautions / Restrictions Precautions Precautions: Fall Precaution Comments: L inattention and VERY slow gait speed.  Restrictions Weight Bearing Restrictions: No       Mobility Bed Mobility               General bed mobility comments: pt in chair on arrival.  Transfers Overall transfer level: Needs assistance Equipment used: Straight cane Transfers: Sit to/from Stand Sit to Stand: Supervision         General transfer comment: Supervision for safety, especially her fist stand OOB she is a bit stiff and off balance, but was able to walk into the bathroom and manage her own toileting without therapist assit today.     Balance Overall balance assessment: Needs assistance         Standing balance support: During functional activity Standing balance-Leahy Scale: Fair                     ADL Overall ADL's : Needs assistance/impaired Eating/Feeding: Set up Eating/Feeding Details (indicate cue type and reason): was using L hand to eat due to IV being in R elbow area.                     Toilet Transfer: Ambulation;Grab Theme park manager Details (indicate cue type and reason): HHA Toileting- Clothing  Manipulation and Hygiene: Supervision/safety;Sit to/from stand Toileting - Clothing Manipulation Details (indicate cue type and reason): Pt continues to need cues to reach back with L hand as well as R hand when sitting.     Functional mobility during ADLs: Min guard;Cane General ADL Comments: Pt with continued decreased attn. to L side when in hallway looking for objects.  Pt moves slowly and therefore when she does run into something, usually can recover w/o assist.      Vision                 Additional Comments: Pt needed several cues to keep looking to the L when out in hallway navigating environment.   Perception     Praxis      Cognition   Behavior During Therapy: WFL for tasks assessed/performed Overall Cognitive Status: Impaired/Different from baseline Area of Impairment: Memory;Problem solving   Current Attention Level: Selective Memory: Decreased short-term memory;Decreased recall of precautions      Awareness: Emergent Problem Solving: Slow processing;Difficulty sequencing;Requires verbal cues General Comments: Pt continues to have slow processing and in general, slow movements.  Pt does ok in a controlled, small environment like a hospital room but out in hallway can get overwhelmed and not pay attn to what is on her L side.    Extremity/Trunk Assessment  Exercises     Shoulder Instructions       General Comments      Pertinent Vitals/ Pain       Pt did not c/o pain.  Vitals stable.  Home Living                                          Prior Functioning/Environment              Frequency Min 3X/week     Progress Toward Goals  OT Goals(current goals can now be found in the care plan section)  Progress towards OT goals: Progressing toward goals  Acute Rehab OT Goals Patient Stated Goal: to get back to normal so she can go hiking with her daughter OT Goal Formulation: With patient Time For Goal  Achievement: 04/08/14 Potential to Achieve Goals: Good ADL Goals Pt Will Perform Lower Body Bathing: with supervision;with caregiver independent in assisting;sit to/from stand Pt Will Perform Lower Body Dressing: with supervision;sit to/from stand;with caregiver independent in assisting Pt Will Transfer to Toilet: with supervision;ambulating Pt Will Perform Toileting - Clothing Manipulation and hygiene: with supervision;sit to/from stand Pt/caregiver will Perform Home Exercise Program: Left upper extremity;With theraputty;Increased strength;With written HEP provided Additional ADL Goal #1: demonstrate anticipatory awareness during ADL task in minimally distracting environment without redirection to task  Plan Discharge plan remains appropriate    Co-evaluation                 End of Session     Activity Tolerance Patient tolerated treatment well   Patient Left in chair;with call bell/phone within reach;with family/visitor present;with chair alarm set   Nurse Communication Mobility status        Time: 5681-2751 OT Time Calculation (min): 17 min  Charges: OT General Charges $OT Visit: 1 Procedure OT Treatments $Self Care/Home Management : 8-22 mins  Vickki Muff 03/28/2014, 12:59 PM [phone]

## 2014-03-30 LAB — CULTURE, BLOOD (ROUTINE X 2)
CULTURE: NO GROWTH
Culture: NO GROWTH

## 2014-04-01 ENCOUNTER — Ambulatory Visit (HOSPITAL_COMMUNITY)
Admit: 2014-04-01 | Discharge: 2014-04-01 | Disposition: A | Discharge status: Home / Self Care | Payer: Medicaid Other | Source: Ambulatory Visit | Attending: Neurology | Admitting: Neurology

## 2014-04-01 DIAGNOSIS — I69959 Hemiplegia and hemiparesis following unspecified cerebrovascular disease affecting unspecified side: Secondary | ICD-10-CM | POA: Diagnosis not present

## 2014-04-01 DIAGNOSIS — IMO0001 Reserved for inherently not codable concepts without codable children: Secondary | ICD-10-CM | POA: Insufficient documentation

## 2014-04-01 DIAGNOSIS — M6281 Muscle weakness (generalized): Secondary | ICD-10-CM | POA: Insufficient documentation

## 2014-04-01 DIAGNOSIS — I69354 Hemiplegia and hemiparesis following cerebral infarction affecting left non-dominant side: Secondary | ICD-10-CM

## 2014-04-01 DIAGNOSIS — R279 Unspecified lack of coordination: Secondary | ICD-10-CM | POA: Diagnosis not present

## 2014-04-01 DIAGNOSIS — R262 Difficulty in walking, not elsewhere classified: Secondary | ICD-10-CM | POA: Insufficient documentation

## 2014-04-01 DIAGNOSIS — I639 Cerebral infarction, unspecified: Secondary | ICD-10-CM | POA: Insufficient documentation

## 2014-04-01 LAB — ANTIPHOSPHOLIPID SYNDROME EVAL, BLD
ANTICARDIOLIPIN IGA: 7 U/mL — AB (ref <22)
ANTICARDIOLIPIN IGG: 0 GPL U/mL — AB (ref <23)
Anticardiolipin IgM: 2 MPL U/mL — ABNORMAL LOW (ref <11)
DRVVT: 29.3 secs (ref <42.9)
Lupus Anticoagulant: NOT DETECTED
PHOSPHATYDALSERINE, IGA: 7 U/mL (ref <20)
PHOSPHATYDALSERINE, IGM: 9 U/mL (ref <22)
PTT Lupus Anticoagulant: 34.7 secs (ref 28.0–43.0)
Phosphatydalserine, IgG: 6 U/mL (ref <16)

## 2014-04-01 NOTE — Evaluation (Addendum)
Physical Therapy Evaluation  Patient Details  Name: Regina [name] MRN: [id number] Date of Birth: Aug 23, 1966  Today's Date: 04/01/2014 Time: 0940-1015 PT Time Calculation (min): 35 min Charge:  evaluation             Visit#: 1 of 1  Re-eval:   Assessment Diagnosis: Rt frontal lobe CVA s/p angiogram  Authorization: no insurance    Past Medical History:  Past Medical History  Diagnosis Date  . Mitral valve prolapse   . Stroke 03/22/2014   Past Surgical History:  Past Surgical History  Procedure Laterality Date  . Root canal    . Tee without cardioversion N/A 03/26/2014    Procedure: TRANSESOPHAGEAL ECHOCARDIOGRAM (TEE);  Surgeon: Larey Dresser, MD;  Location: [facility] ENDOSCOPY;  Service: Cardiovascular;  Laterality: N/A;    Subjective Symptoms/Limitations Symptoms: Pt states that her mother came to live with her after she was discharged from the hospital.  She states she is slow walking and occasionally looses her balance,(running into doors). Pertinent History: 48 y.o. female admitted to [facility] on 03/23/14 with left sided weakness and HA.  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 Pt s/p cerebral angiogram on 03/25/14.  Pt also found to have HTN.   Only significant PMHx is MVP. CT scan and US show suspicious mass on kidney and possibly ovary How long can you sit comfortably?: no problem How long can you stand comfortably?: able to stand for 5 minutes.  How long can you walk comfortably?: longest she has walked has been less than 5 minutes. She is not using an assistive device.  She has someone walk with her up the steps to her bedroom  Special Tests: MOCA  Pain Assessment Currently in Pain?: No/denies  Precautions/Restrictions  Precautions Precautions: Fall Precaution Comments: L inattention and VERY slow gait speed.  Restrictions Weight Bearing Restrictions: No  Balance Screening Balance Screen Has the patient fallen in the past 6 months: Yes How many times?: 1 Has the patient had  a decrease in activity level because of a fear of falling? : No Is the patient reluctant to leave their home because of a fear of falling? 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,  Cognition/Observation Cognition Overall Cognitive Status: Within Functional Limits for tasks assessed  Sensation/Coordination/Flexibility/Functional Tests Sensation Light Touch: Appears Intact Coordination Gross Motor Movements are Fluid and Coordinated: No Fine Motor Movements are Fluid and Coordinated: No Coordination and Movement Description: slow motor movement patterns 9 Hole Peg Test: 31.8" Left 21.7" Right  Assessment RLE Strength Right Hip Flexion: 5/5 Right Hip Extension: 3+/5 Right Hip ABduction: 5/5 Right Knee Flexion: 3/5 Right Knee Extension: 5/5 Right Ankle Dorsiflexion: 4/5 LLE Strength Left Hip Flexion:  (4-/5) Left Hip Extension: 2+/5 Left Hip ABduction: 3/5 Left Knee Flexion: 3+/5 Left Knee Extension: 5/5 Left Ankle Dorsiflexion:  (4-/5)  Exercise/Treatments Mobility/Balance  Berg Balance Test Sit to Stand: Able to stand without using hands and stabilize independently Standing Unsupported: Able to stand safely 2 minutes Sitting with Back Unsupported but Feet Supported on Floor or Stool: Able to sit safely and securely 2 minutes Stand to Sit: Sits safely with minimal use of hands Transfers: Able to transfer safely, minor use of hands Standing Unsupported with Eyes Closed: Able to stand 10 seconds safely Standing Ubsupported with Feet Together: Able to place feet together independently and stand 1  minute safely From Standing, Reach Forward with Outstretched Arm: Can reach confidently >25 cm (10") From Standing Position, Pick up Object from Floor: Able to pick up shoe safely and easily From  Standing Position, Turn to Look Behind Over each Shoulder: Looks behind one side only/other side shows less weight shift Turn 360 Degrees: Able to turn 360 degrees safely but slowly Standing Unsupported, Alternately Place Feet on Step/Stool: Able to stand independently and safely and complete 8 steps in 20 seconds Standing Unsupported, One Foot in Front: Able to plae foot ahead of the other independently and hold 30 seconds Standing on One Leg: Able to lift leg independently and hold > 10 seconds Total Score: 52      Supine Bridges: 10 reps Straight Leg Raises: Left;10 reps Sidelying Hip ABduction: Left;10 reps Hip ADduction: Left;10 reps Prone  Hamstring Curl: 10 reps      Physical Therapy Assessment and Plan PT Assessment and Plan Clinical Impression Statement: Pt is a 48 yo female who had a Rt CVA.  She demonstrates decreased strength of her Lt LE.  She has been refered for out patient therapy.  Due to having no insurance pt requests a HEP at this time which was given to pt.  Pt will benefit from skilled therapeutic intervention in order to improve on the following deficits: Difficulty walking;Decreased activity tolerance;Decreased strength Rehab Potential: Good PT Plan: One time evaluation only.    Goals Home Exercise Program Pt/caregiver will Perform Home Exercise Program: For increased strengthening PT Goal: Perform Home Exercise Program - Progress: Goal set today  Problem List Patient Active Problem List   Diagnosis Date Noted  . CVA (cerebral infarction) 04/01/2014  . 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 Lack of coordination 04/01/2014  . Muscle weakness (generalized) 04/01/2014  . Stroke 03/24/2014    General Behavior During Therapy: [facility] for tasks assessed/performed  GP    Leeroy Cha 04/01/2014, 11:04 AM  Physician Documentation Your signature is required to indicate approval of the treatment plan as stated above.   Please sign and either send electronically or make a copy of this report for your files and return this physician signed original.   Please mark one 1.__approve of plan  2. ___approve of plan with the following conditions.   ______________________________                                                          _____________________ Physician Signature                                                                                                             Date

## 2014-04-01 NOTE — Evaluation (Addendum)
Occupational Therapy Evaluation  Patient Details  Name: Regina [name] MRN: [id number] Date of Birth: 1966/11/04  Today's Date: 04/01/2014 Time: 5093-2671 OT Time Calculation (min): 35 min OT eval [id number] 68'  Visit#: 1 of 16  Re-eval: 05/13/14  Assessment Diagnosis: s/p right CVA  Next MD Visit: 4 weeks - Leonie Man Prior [facility]: [facility]  Authorization: medicaid pending  Authorization Time Period:    Authorization Visit#:   of     Past Medical History:  Past Medical History  Diagnosis Date  . Mitral valve prolapse   . Stroke 03/22/2014   Past Surgical History:  Past Surgical History  Procedure Laterality Date  . Root canal    . Tee without cardioversion N/A 03/26/2014    Procedure: TRANSESOPHAGEAL ECHOCARDIOGRAM (TEE);  Surgeon: Larey Dresser, MD;  Location: [facility] ENDOSCOPY;  Service: Cardiovascular;  Laterality: N/A;    Subjective Symptoms/Limitations Symptoms: S: I still bump into things on the left side.  Pertinent History:  Regina [name] is a 48 y.o. female with a history of MVP who presents with left-sided weakness that started 5/2 around lunchtime. She states that she noticed that she was having difficulty folding clothes. Later that day when she lay down for a nap, she noticed some headache. She stumbled and fell against a dishwasher and her child insisted that she come into the emergency room. CT demonstrated a right frontal lobar hemorrhage. Patient was not administered TPA secondary to hemorrahge. Dr. Leonie Man has referred patient to occupational therapy for evaluation and treatment.  Special Tests: MOCA 28/30 Patient Stated Goals: To use left side as normal. Pain Assessment Currently in Pain?: No/denies  Precautions/Restrictions  Precautions Precautions: Fall Precaution Comments: L inattention and VERY slow gait speed.  Restrictions Weight Bearing Restrictions: No  Balance Screening Balance Screen Has the patient fallen in the past 6 months: Yes How  many times?: 1 Has the patient had a decrease in activity level because of a fear of falling? : No Is the patient reluctant to leave their home because of a fear of falling? 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,  Assessment ADL/Vision/Perception ADL ADL Comments: curling hair, left side neglect, holding onto things, moving in slow motion,  Dominant Hand: Right Vision - History Baseline Vision: Wears glasses only for reading Patient Visual Report: No change from baseline Vision - Assessment Eye Alignment: Within Functional Limits Vision Assessment: Vision tested Alignment/Gaze Preference: Within Defined Limits Visual Fields: No apparent deficits  Cognition/Observation Cognition Overall Cognitive Status: Within Functional Limits for tasks assessed  Sensation/Coordination/Edema Sensation Light Touch: Appears Intact Coordination Gross Motor Movements are Fluid and Coordinated: No Fine Motor Movements are Fluid and Coordinated: No Coordination and Movement Description: slow motor movement patterns 9 Hole Peg Test: 31.8" Left 21.7" Right  Additional Assessments RUE Assessment RUE Assessment: Within Functional Limits RUE Strength Right Shoulder Flexion: 5/5 Right Shoulder ABduction: 5/5 Right Elbow Flexion: 5/5 Grip (lbs): 60 Lateral Pinch: 14 lbs 3 Point Pinch: 13 lbs LUE Assessment LUE Assessment:  (slow motor movement patterns) LUE Strength Left Shoulder Flexion:  (4-/5) Left Shoulder ABduction:  (4-/5) Left Elbow Flexion:  (4-/5) Grip (lbs): 30 Lateral Pinch: 8 lbs 3 Point Pinch: 9 lbs   Occupational Therapy Assessment and Plan OT Assessment and Plan Clinical Impression Statement: Patient is a 48 y/o female s/p right CVA resulting in decreased  strength, fine motor  coordination, and functional performance during ADL tasks. Patient is Medicaid pending and has requested to wait and make appointments until she hears about approval.  Rehab Potential: Excellent OT Duration: 8 weeks OT Plan: P:  Skilled OT intervention is indicated to improve LUE function and patients functional level of I with all B/IADLs, work, and leisure activities.  Treatment focus on LUE strengthening, grip and pinch strength, and fine and gross motor coordination tasks.    Goals Short Term Goals Time to Complete Short Term Goals: 4 weeks Short Term Goal 1: Patient will be educated on HEP. Short Term Goal 2: Patient will increase LUE strength to 4+/5 to increase functional performance during daily tasks.  Short Term Goal 3: Patient will increase grip strength by 5# and pinch strength by 2# to increase ability to pick up and hold onto items without dropping.  Short Term Goal 4: Patient will increase fine motor coordination by decreasing time to complete 9 hole peg test by 5 seconds.  Long Term Goals Time to Complete Long Term Goals: 8 weeks Long Term Goal 1: Patient will return to highest level of independence with all B/IADL and leisure activities.  Long Term Goal 2: Patient will increase LUE strength to 4+/5 to increase functional performance during daily tasks.  Long Term Goal 3: Patient will increase grip strength by 10# and pinch strength by 4# to increase ability to pick up and hold onto items without dropping.  Long Term Goal 4: Patient will increase fine motor coordination by decreasing time to complete 9 hole peg test by 10 seconds.   Problem List Patient Active Problem List   Diagnosis Date Noted  . CVA (cerebral infarction) 04/01/2014  . 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 Lack of coordination 04/01/2014  . Muscle weakness (generalized) 04/01/2014  . Stroke 03/24/2014    End of Session Activity Tolerance: Patient tolerated treatment  well General Behavior During Therapy: [facility] for tasks assessed/performed OT Plan of Care OT Home Exercise Plan: fine motor coordination exercises  OT Patient Instructions: handout (scanned) Consulted and Agree with Plan of Care: Patient  GO    Ailene Ravel, OTR/L,CBIS   04/01/2014, 10:43 AM  Physician Documentation Your signature is required to indicate approval of the treatment plan as stated above.  Please sign and either send electronically or make a copy of this report for your files and return this physician signed original.  Please mark one 1.__approve of plan  2. ___approve of plan with the following conditions.   ______________________________                                                          _____________________ Physician Signature  Date  

## 2014-04-08 NOTE — Discharge Summary (Signed)
Stroke Discharge Summary  Patient ID: Regina [name]   MRN: [id number]      DOB: February 01, 1966  Date of Admission: 03/23/2014 Date of Discharge: 03/28/2014  Attending Physician:  Suzzanne Cloud, MD, Stroke MD  Consulting Physician(s):     Interventional Radiology  - cerebral angiogram - no intervention Patient's PCP:  No primary provider on file.  Discharge Diagnoses:  Right frontal lobe parenchymal hemorrhage   Active Problems:   Stroke  BMI: Body mass index is 19.44 kg/(m^2).  Past Medical History  Diagnosis Date  . Mitral valve prolapse   . Stroke 03/22/2014   Past Surgical History  Procedure Laterality Date  . Root canal    . Tee without cardioversion N/A 03/26/2014    Procedure: TRANSESOPHAGEAL ECHOCARDIOGRAM (TEE);  Surgeon: Larey Dresser, MD;  Location: [facility];  Service: Cardiovascular;  Laterality: N/A;      Medication List    STOP taking these medications       BIOTIN PO     penicillin v potassium 250 MG tablet  Commonly known as:  VEETID        LABORATORY STUDIES CBC    Component Value Date/Time   WBC 11.2* 03/26/2014 0516   RBC 5.42* 03/26/2014 0516   HGB 17.6* 03/26/2014 0516   HCT 52.9* 03/26/2014 0516   PLT 553* 03/26/2014 0516   MCV 97.6 03/26/2014 0516   MCH 32.5 03/26/2014 0516   MCHC 33.3 03/26/2014 0516   RDW 13.6 03/26/2014 0516   LYMPHSABS 1.9 03/23/2014 2052   MONOABS 0.6 03/23/2014 2052   EOSABS 0.3 03/23/2014 2052   BASOSABS 0.2* 03/23/2014 2052   CMP    Component Value Date/Time   NA 142 03/25/2014 0550   K 3.6* 03/25/2014 0550   CL 105 03/25/2014 0550   CO2 24 03/25/2014 0550   GLUCOSE 84 03/25/2014 0550   BUN 8 03/25/2014 0550   CREATININE 0.58 03/25/2014 0550   CALCIUM 8.1* 03/25/2014 0550   PROT 6.4 03/23/2014 2052   ALBUMIN 3.6 03/23/2014 2052   AST 15 03/23/2014 2052   ALT 11 03/23/2014 2052   ALKPHOS 71 03/23/2014 2052   BILITOT 0.4 03/23/2014 2052   GFRNONAA >90 03/25/2014 0550   GFRAA >90 03/25/2014 0550   COAGS Lab Results  Component Value Date   INR  1.06 03/23/2014   Lipid Panel No results found for this basename: chol, trig, hdl, cholhdl, vldl, ldlcalc   HgbA1C  No results found for this basename: HGBA1C   Cardiac Panel (last 3 results) No results found for this basename: CKTOTAL, CKMB, TROPONINI, RELINDX,  in the last 72 hours Urinalysis    Component Value Date/Time   COLORURINE YELLOW 03/23/2014 Daniel 03/23/2014 2252   LABSPEC 1.010 03/23/2014 2252   PHURINE 6.0 03/23/2014 2252   GLUCOSEU NEGATIVE 03/23/2014 2252   HGBUR MODERATE* 03/23/2014 2252   BILIRUBINUR NEGATIVE 03/23/2014 2252   KETONESUR NEGATIVE 03/23/2014 2252   PROTEINUR NEGATIVE 03/23/2014 2252   UROBILINOGEN 0.2 03/23/2014 2252   NITRITE NEGATIVE 03/23/2014 2252   LEUKOCYTESUR NEGATIVE 03/23/2014 2252   Urine Drug Screen     Component Value Date/Time   LABOPIA NONE DETECTED 03/23/2014 2252   COCAINSCRNUR NONE DETECTED 03/23/2014 2252   LABBENZ NONE DETECTED 03/23/2014 2252   AMPHETMU NONE DETECTED 03/23/2014 2252   THCU NONE DETECTED 03/23/2014 2252   LABBARB NONE DETECTED 03/23/2014 2252    Alcohol Level    Component Value Date/Time   ETH <  11 03/23/2014 2052     SIGNIFICANT DIAGNOSTIC STUDIES  CT of the brain 03/23/2014 There is a 2 x 3.4 cm acute right frontal intraparenchymal hematoma with mild surrounding edema. There is no mass effect, hydrocephalus or transtentorial herniation.   CT angio of the head 03/24/2014 Contracting right frontal 33 x 18 mm intraparenchymal hematoma, no new hemorrhage. Local mass effect without midline shift. Normal CT angiogram of the head.   Cerebral angio head 03/25/2014 1.No angio evidence of AV shunting,aneurysm, Davf or Of a nidus. 2.Venous ourflow WNLs. Relative prominence of distal Rt pericallosal artery?significance   CT of the chest abdomen and pelvis  03/27/2014  1. No suspected intrathoracic or intra-abdominal malignancy as a cause of intracranial hemorrhage. 2. 15 mm nodule in the lingula is most likely inflammatory  given surrounding airspace disease. If a mass is discovered on surveillance brain imaging, repeat chest CT recommended. A followup chest x-ray in 6 to 8 weeks would also be prudent. 3. Bilateral adnexal masses, likely ovarian, measuring up to the 6 cm on the left. 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 Recommend dedicated pelvic ultrasound.   Cerebral angiogram Angiographically mildly prominent distal right pericallosal artery, without evidence of arteriovenous shunting, or of angiographic presence of a nidus being noted in the right anterior to mid frontal cortical subcortical area. No evidence of a dural AV fistula, dissections or of intraluminal filling defects seen.  Venous drainage within normal limits.  Given the above angiographic findings, a follow-up study is suggested in order to evaluate for possibly of an underlying vessel malformation not visualized at this time.   MRI of the brain 03/24/2014 1. Stable appearance of a right frontal lobe parenchymal hemorrhage without evidence of an underlying etiology. Follow-up MRI of the brain at 2-3 months without and with contrast could be used for further evaluation after the acute blood products have resolved. 2. Otherwise normal MRI appearance of the brain .  2D Echocardiogram EF 50-55% with no source of embolus.   Renal ultrasound  Indeterminate hypoechoic mass left adnexa further evaluation with dedicated pelvic ultrasound recommended. Otherwise unremarkable renal ultrasound   TEE 03/26/2014 No source of embolus. No vegetation.   EKG normal sinus rhythm. For complete results please see formal report.      History of Present Illness   Regina [name] is a 48 y.o. female with a history of MVP who presents with left-sided weakness that started 5/22015 around lunchtime. She states that she noticed that she was having difficulty folding clothes. Later that day when she lay down for a nap, she noticed some headache. This  has since resolved. On the day of admission she stumbled and fell against a dishwasher and her child insisted that she come into the emergency room.  Hospital Course  This patient was admitted to the [facility] neuro intensive care unit on 03/24/2014 with an intraparenchymal hemorrhage of uncertain etiology. A cerebral angiogram was performed following day which was unrevealing. The patient was evaluated by the physical occupational and speech therapists. Outpatient physical and occupational therapies were recommended.   The patient had undergone a root canal on March 2 after which he was treated with Unna myosin. Cardiology was asked to see the patient for a TEE. This was performed on 03/26/2014 and showed no source of emboli and no vegetation on the valves.  A vasculitis panel was performed and showed no significant findings. A CT of the chest and abdomen was performed to rule out malignancy. The patient was  found to have a 15 mm nodule in the lingula most likely inflammatory given surrounding airspace disease. If a mass is discovered on surveillance brain imaging, repeat chest CT recommended. A followup chest x-ray in 6 to 8 weeks would also be prudent. Bilateral adnexal masses, likely ovarian, measuring up to the 6 cm on the left. 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 Recommend dedicated pelvic ultrasound.   Arrangements are actually made to discharge the patient in stable and improved condition on 03/28/2014. Dr. Kennon Rounds from OB/GYN was contacted for followup of the adnexal mass. The patient would also follow up with Dr. Leonie Man for further brain imaging and followup Chest CT in 6-8 weeks to evaluate the above-noted nodule.   Patient with vascular risk factors of:   None known   Patient also with:  Mitral valve prolapse  History of a miscarriage at 12 weeks  Polycythemia of uncertain etiology  Hypokalemia  Root canal March 2 created with clindamycin  Adnexal mass to be  evaluated on an outpatient basis by OB/GYN  15 mm nodule in the lingula with a repeat chest CT recommended  Patient with continued stroke symptoms of left hemiparesis and hemisensory deficits as well as mild facial asymmetry. Physical therapy, occupational therapy and speech therapy evaluated patient. They recommend continued outpatient physical and occupational therapies.  Discharge Exam  Blood pressure 115/59, pulse 79, temperature 98.4 F (36.9 C), temperature source Oral, resp. rate 16, height 5\' 4"  (1.626 m), weight 113 lb 5.1 oz (51.4 kg), last menstrual period 03/16/2014, SpO2 98.00%.  GENERAL EXAM:  MALAISE APPEARANCE, SOFT SPOKEN.  CARDIOVASCULAR:  Regular rate and rhythm, no murmurs, no carotid bruits  NEUROLOGIC:  MENTAL STATUS: awake, alert, language fluent, comprehension intact, naming intact, fund of knowledge appropriate  CRANIAL NERVE: pupils equal and reactive to light, visual fields full to confrontation, extraocular muscles intact, no nystagmus, facial sensation symmetric, SLIGHTLY DECR LEFT NL FOLD, hearing intact, palate elevates symmetrically, uvula midline, shoulder shrug symmetric, tongue midline.  MOTOR: normal bulk and tone, full strength in the RUE, RLE; LUE 4, LLE 4  SENSORY: normal and symmetric to light touch, pinprick, temperature, vibration and proprioception  COORDINATION: finger-nose-finger, fine finger movements SLOW IN LUE  REFLEXES: deep tendon reflexes present and symmetric  GAIT/STATION: IN BED  Discharge Diet   cardiac diet with thin liquids  Discharge Plan    Disposition:  The patient was discharged to home with supervision.  No anticoagulants or anti-thrombotics secondary to hemorrhage for secondary stroke prevention.  Ongoing risk factor control by Primary Care Physician.  Risk factor recommendations: none  Follow-up No primary provider on file. in 2 weeks.  Follow-up with Dr. Antony Contras, Stroke Clinic in 2 months.  >30 minutes were  spent preparing discharge.  Signed Mikey Bussing PA-C [facility] Pager [phone] 04/08/2014, 4:51 PM  I have personally examined this patient, reviewed pertinent data and developed the plan of care. I agree with above.   Penni Bombard, MD 06/02/4579, 9:98 PM Certified in Neurology, Neurophysiology and Neuroimaging  [facility] [address] [place], [place] [address] [phone]

## 2014-04-16 ENCOUNTER — Telehealth: Payer: Self-pay | Admitting: Nurse Practitioner

## 2014-04-16 ENCOUNTER — Telehealth: Payer: Self-pay

## 2014-04-16 DIAGNOSIS — N838 Other noninflammatory disorders of ovary, fallopian tube and broad ligament: Secondary | ICD-10-CM

## 2014-04-16 DIAGNOSIS — N83209 Unspecified ovarian cyst, unspecified side: Secondary | ICD-10-CM

## 2014-04-16 NOTE — Telephone Encounter (Signed)
U/S appointment made for 04/21/14 at 1400; pt. Advised to arrive at 1345 with a full bladder. Patient to follow up with Dr. Kennon Rounds 04/25/14 at 1000 at [facility] office. Patient informed of both appointment date, times and locations. No questions or concerns.

## 2014-04-16 NOTE — Telephone Encounter (Signed)
Patient calling requesting a rx for sleeping pills, unable to sleep since stroke 5/3.  Please call and advise.  Thanks

## 2014-04-18 ENCOUNTER — Ambulatory Visit: Payer: MEDICAID | Admitting: Gynecology

## 2014-04-18 NOTE — Telephone Encounter (Signed)
Have primary MD address

## 2014-04-21 ENCOUNTER — Ambulatory Visit (HOSPITAL_COMMUNITY)
Admission: RE | Admit: 2014-04-21 | Discharge: 2014-04-21 | Disposition: A | Discharge status: Home / Self Care | Payer: Medicaid Other | Source: Ambulatory Visit | Attending: Family Medicine | Admitting: Family Medicine

## 2014-04-21 DIAGNOSIS — N838 Other noninflammatory disorders of ovary, fallopian tube and broad ligament: Secondary | ICD-10-CM

## 2014-04-21 DIAGNOSIS — R9389 Abnormal findings on diagnostic imaging of other specified body structures: Secondary | ICD-10-CM | POA: Insufficient documentation

## 2014-04-21 DIAGNOSIS — N9489 Other specified conditions associated with female genital organs and menstrual cycle: Secondary | ICD-10-CM | POA: Insufficient documentation

## 2014-04-21 DIAGNOSIS — N83209 Unspecified ovarian cyst, unspecified side: Secondary | ICD-10-CM | POA: Insufficient documentation

## 2014-04-21 NOTE — Telephone Encounter (Signed)
I called the patient back.  Got no answer.  Left message. 

## 2014-04-25 ENCOUNTER — Encounter: Payer: Self-pay | Admitting: Family Medicine

## 2014-04-25 ENCOUNTER — Ambulatory Visit (INDEPENDENT_AMBULATORY_CARE_PROVIDER_SITE_OTHER): Payer: Medicaid Other | Admitting: Family Medicine

## 2014-04-25 VITALS — BP 120/76 | HR 77 | Ht 64.0 in | Wt 109.0 lb

## 2014-04-25 DIAGNOSIS — N839 Noninflammatory disorder of ovary, fallopian tube and broad ligament, unspecified: Secondary | ICD-10-CM

## 2014-04-25 DIAGNOSIS — N838 Other noninflammatory disorders of ovary, fallopian tube and broad ligament: Secondary | ICD-10-CM

## 2014-04-25 NOTE — Patient Instructions (Signed)
Endometriosis Endometriosis is a condition in which the tissue that lines the uterus (endometrium) grows outside of its normal location. The tissue may grow in many locations close to the uterus, but it commonly grows on the ovaries, fallopian tubes, vagina, or bowel. Because the uterus expels, or sheds, its lining every menstrual cycle, there is bleeding wherever the endometrial tissue is located. This can cause pain because blood is irritating to tissues not normally exposed to it.  CAUSES  The cause of endometriosis is not known.  SIGNS AND SYMPTOMS  Often, there are no symptoms. When symptoms are present, they can vary with the location of the displaced tissue. Various symptoms can occur at different times. Although symptoms occur mainly during a woman's menstrual period, they can also occur midcycle and usually stop with menopause. Some people may go months with no symptoms at all. Symptoms may include:   Back or abdominal pain.   Heavier bleeding during periods.   Pain during intercourse.   Painful bowel movements.   Infertility. DIAGNOSIS  Your health care provider will do a physical exam and ask about your symptoms. Various tests may be done, such as:   Blood tests and urine tests. These are done to help rule out other problems.   Ultrasound. This test is done to look for abnormal tissue.   An X-ray of the lower bowel (barium enema).  Laparoscopy. In this procedure, a thin, lighted tube with a tiny camera on the end (laparoscope) is inserted into your abdomen. This helps your health care provider look for abnormal tissue to confirm the diagnosis. The health care provider may also remove a small piece of tissue (biopsy) from any abnormal tissue found. This tissue sample can then be sent to a lab so it can be looked at under a microscope. TREATMENT  Treatment will vary and may include:   Medicines to relieve pain. Nonsteroidal anti-inflammatory drugs (NSAIDs) are a type of  pain medicine that can help to relieve the pain caused by endometriosis.  Hormonal therapy. When using hormonal therapy, periods are eliminated. This eliminates the monthly exposure to blood by the displaced endometrial tissue.   Surgery. Surgery may sometimes be done to remove the abnormal endometrial tissue. In severe cases, surgery may be done to remove the fallopian tubes, uterus, and ovaries (hysterectomy). HOME CARE INSTRUCTIONS   Only take over-the-counter or prescription medicines for pain, discomfort, or fever as directed by your health care provider. Do not take aspirin because it may increase bleeding when you are not on hormonal therapy.   Avoid activities that produce pain, including sexual activity. SEEK MEDICAL CARE IF:  You have pelvic pain before, after, or during your periods.  You have pelvic pain between periods that gets worse during your period.  You have pelvic pain during or after sex.  You have pelvic pain with bowel movements or urination, especially during your period.  You have problems getting pregnant. SEEK IMMEDIATE MEDICAL CARE IF:   Your pain is severe and is not responding to pain medicine.   You have severe nausea and vomiting, or you cannot keep foods down.   You have pain that is limited to the right lower part of your abdomen.   You have swelling or increasing pain in your abdomen.   You see blood in your stool.   You have a fever or persistent symptoms for more than 2 3 days.   You have a fever and your symptoms suddenly get worse. MAKE SURE YOU:  Understand these instructions.  Will watch your condition.  Will get help right away if you are not doing well or get worse. Document Released: 11/04/2000 Document Revised: 08/28/2013 Document Reviewed: 07/05/2013 [facility] Patient Information 2014 [place].

## 2014-04-28 NOTE — Assessment & Plan Note (Signed)
Strongly suspect endometriomas which have likely been present for some time.  Will follow up with a repeat sonogram in 3 months.  If unchanged, would not do anything except watchful waiting.  Should improve with menopause, which will be coming soon.

## 2014-04-28 NOTE — Progress Notes (Signed)
Subjective:    Patient ID: Regina [name] is a 48 y.o. female presenting with ovarian mass  on 04/25/2014  HPI: New pt. Referred following admission to hospital following acute hemorrhagic stroke.  Imaging during that time revealed bilateral ovarian masses.  Patient has been having some irregular cycles.  Notes no pelvic pain history.  Had no trouble with infertility.  Is recently separated.  She underwent pelvic sonography which showed probable benign endometriomas.  Review of Systems  Constitutional: Negative for fever and chills.  Respiratory: Negative for shortness of breath.   Cardiovascular: Negative for chest pain.  Gastrointestinal: Negative for nausea, vomiting and abdominal pain.  Genitourinary: Negative for dysuria.  Skin: Negative for rash.  Neurological: Positive for weakness and numbness. Negative for tremors, seizures and headaches.  Hematological: Does not bruise/bleed easily.  Psychiatric/Behavioral: Negative for dysphoric mood.      Objective:    BP 120/76  Pulse 77  Ht 5\' 4"  (1.626 m)  Wt 109 lb (49.442 kg)  BMI 18.70 kg/m2  LMP 04/01/2014 Physical Exam  Constitutional: She is oriented to person, place, and time. She appears well-developed and well-nourished. No distress.  HENT:  Head: Normocephalic and atraumatic.  Eyes: No scleral icterus.  Neck: Neck supple.  Cardiovascular: Normal rate.   Pulmonary/Chest: Effort normal.  Abdominal: Soft.  Neurological: She is alert and oriented to person, place, and time.  Skin: Skin is warm and dry.  Psychiatric: She has a normal mood and affect.    Imaging: US Pelvis Complete  04/21/2014   CLINICAL DATA:  48 year old female with left adnexal mass discovered on renal ultrasound. Bilateral adnexal masses confirmed on recent CT Abdomen and Pelvis. Subsequent encounter.  The patient is premenopausal, not on hormone replacement therapy. LMP stated as 03/16/2014.  EXAM: TRANSABDOMINAL AND TRANSVAGINAL ULTRASOUND OF  PELVIS  TECHNIQUE: COMPARISON:  CT Abdomen and Pelvis 03/27/2014. Renal ultrasound 03/26/2014.  FINDINGS: Uterus  Measurements: 8.9 x 5.5 x 5.0 cm. No fibroids or other mass visualized.  Endometrium  Thickness: Thickened 25 mm. Occasional tiny hypoechoic areas (image 42, 50), No other focal abnormality visualized.  Right ovary  Measurements: 5.5 x 2.7 x 2.9 cm. Complex lesion measuring 4.4 x 2.5 x 2.5 cm has fairly homogeneous low level echoes throughout and no vascularity on power Doppler (image 70). Smaller adjacent simple appearing cyst measuring 14 mm diameter.  Left ovary  Measurements: 6.0 x 5.9 x 5.7 cm. Large lesion 5.1 x 5.0 x 4.8 cm mildly do heterogeneous but largely homogeneous with low level echoes (image 55) without vascularity (image 58).  Other findings  No free fluid.  IMPRESSION: 1. Bilateral ovarian masses have sonographic characteristics suggestive of endometrioma. Initial follow-up ultrasound at 6-12 weeks recommended (these have been stable for just less than 1 month thus far), then, if not surgically removed yearly follow-up recommended. This recommendation follows the consensus statement: Management of Asymptomatic Ovarian and Other Adnexal Cysts Imaged at [place]: Society of Radiologists in [place]. Radiology 2010; [phone].  2. Endometrial thickening, likely due to secretory phase.   Electronically Signed   By: Lars Pinks M.D.   On: 04/21/2014 15:35      Assessment & Plan:   Problem List Items Addressed This Visit     Unprioritized   Ovarian mass - Primary     Strongly suspect endometriomas which have likely been present for some time.  Will follow up with a repeat sonogram in 3 months.  If unchanged, would not do anything  except watchful waiting.  Should improve with menopause, which will be coming soon.    Relevant Orders      US Transvaginal Non-OB      US Pelvis Complete       Return in about 3 months (around 07/26/2014) for a  follow-up.

## 2014-05-19 ENCOUNTER — Ambulatory Visit (HOSPITAL_COMMUNITY)
Admission: RE | Admit: 2014-05-19 | Discharge: 2014-05-19 | Disposition: A | Discharge status: Home / Self Care | Payer: Medicaid Other | Source: Ambulatory Visit | Attending: Nurse Practitioner | Admitting: Nurse Practitioner

## 2014-05-19 DIAGNOSIS — R262 Difficulty in walking, not elsewhere classified: Secondary | ICD-10-CM | POA: Insufficient documentation

## 2014-05-19 DIAGNOSIS — R279 Unspecified lack of coordination: Secondary | ICD-10-CM | POA: Insufficient documentation

## 2014-05-19 DIAGNOSIS — I69959 Hemiplegia and hemiparesis following unspecified cerebrovascular disease affecting unspecified side: Secondary | ICD-10-CM | POA: Diagnosis not present

## 2014-05-19 DIAGNOSIS — IMO0001 Reserved for inherently not codable concepts without codable children: Secondary | ICD-10-CM | POA: Diagnosis not present

## 2014-05-19 DIAGNOSIS — M6281 Muscle weakness (generalized): Secondary | ICD-10-CM | POA: Diagnosis not present

## 2014-05-19 NOTE — Progress Notes (Signed)
Occupational Therapy Treatment Patient Details  Name: Regina [name] MRN: [id number] Date of Birth: 08-13-1966  Today's Date: 05/19/2014 Time: 9833-8250 OT Time Calculation (min): 44 min Theract 5397-6734 44'  Visit#: 2 of 16  Re-eval: 06/03/14    Authorization: medicaid pending (Patient states she has been approved a certain number of visits. Need to verify this.) Authorization Time Period:    Authorization Visit#:   of    Subjective Symptoms/Limitations Symptoms: S: I find that if I work my left side a lot I will be really sore.  Pain Assessment Currently in Pain?: No/denies  Precautions/Restrictions  Precautions Precautions: None  Exercise/Treatments Hand Exercises Theraputty - Flatten: red Theraputty - Roll: red Theraputty - Grip: red Theraputty - Pinch: red Theraputty - Locate Pegs: red- 8 beads Hand Gripper with Medium Beads: 15 beads; black handgripper Sponges: Pt used handgripper set at 38# to pick up cube sponges and place in container to increase grip strength. Min difficulty.  (Sponges: 36)     Activities of Daily Living Activities of Daily Living: Patient given handout regarding warning signs of stroke. Reviewed risk factors for having a stroke.   Occupational Therapy Assessment and Plan OT Assessment and Plan Clinical Impression Statement: A: Pt completed fine and gross motor activities as well as grip and pinch strengthening tasks. Patient stated that her hand was going to be sore later today due to the exercises as it freqnuently is when she uses it a lot.  OT Plan: P: LUE strengthening, theraband, 1# hand weights, UBE bike, cybex row/press. Reassess.    Goals Short Term Goals Time to Complete Short Term Goals: 4 weeks Short Term Goal 1: Patient will be educated on HEP. Short Term Goal 1 Progress: Progressing toward goal Short Term Goal 2: Patient will increase LUE strength to 4+/5 to increase functional performance during daily tasks.  Short Term Goal  2 Progress: Progressing toward goal Short Term Goal 3: Patient will increase grip strength by 5# and pinch strength by 2# to increase ability to pick up and hold onto items without dropping.  Short Term Goal 3 Progress: Progressing toward goal Short Term Goal 4: Patient will increase fine motor coordination by decreasing time to complete 9 hole peg test by 5 seconds.  Short Term Goal 4 Progress: Progressing toward goal Long Term Goals Time to Complete Long Term Goals: 8 weeks Long Term Goal 1: Patient will return to highest level of independence with all B/IADL and leisure activities.  Long Term Goal 1 Progress: Progressing toward goal Long Term Goal 2: Patient will increase LUE strength to 4+/5 to increase functional performance during daily tasks.  Long Term Goal 2 Progress: Progressing toward goal Long Term Goal 3: Patient will increase grip strength by 10# and pinch strength by 4# to increase ability to pick up and hold onto items without dropping.  Long Term Goal 3 Progress: Progressing toward goal Long Term Goal 4: Patient will increase fine motor coordination by decreasing time to complete 9 hole peg test by 10 seconds.  Long Term Goal 4 Progress: Progressing toward goal  Problem List Patient Active Problem List   Diagnosis Date Noted  . Ovarian mass 04/25/2014  . CVA (cerebral infarction) 04/01/2014  . 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 Lack of coordination 04/01/2014  . Muscle weakness (generalized) 04/01/2014  . Stroke 03/24/2014    End of Session Activity Tolerance: Patient tolerated treatment well General Behavior During Therapy: [facility] for tasks assessed/performed  Ailene Ravel, OTR/L,CBIS   05/19/2014, 12:08 PM

## 2014-05-21 ENCOUNTER — Telehealth (HOSPITAL_COMMUNITY): Payer: Self-pay

## 2014-05-21 NOTE — Telephone Encounter (Signed)
Date: 05/21/14  Left message for patient to call back and let us know if she has a Medicaid ID number. Number in chart is elapsed. Or we may need to hold treatment or discharge.   Ailene Ravel, OTR/L,CBIS

## 2014-05-30 ENCOUNTER — Telehealth (HOSPITAL_COMMUNITY): Payer: Self-pay

## 2014-06-02 ENCOUNTER — Ambulatory Visit (HOSPITAL_COMMUNITY): Payer: Medicaid Other

## 2014-06-18 NOTE — Progress Notes (Signed)
  Patient Details  Name: Regina [name] MRN: [id number] Date of Birth: 1966/08/01  Today's Date: 06/18/2014 The above patient was discharged from OT on 06/18/2014 secondary to failure to return to clinic since 05/19/14 due to lack of insurance coverage.  Ailene Ravel, OTR/L,CBIS   06/18/2014, 3:37 PM

## 2014-06-18 NOTE — Addendum Note (Signed)
Encounter addended by: Debby Bud, OT on: 06/18/2014  3:38 PM<BR>     Documentation filed: Episodes, Clinical Notes, Letters

## 2014-07-10 ENCOUNTER — Encounter: Payer: Self-pay | Admitting: Nurse Practitioner

## 2014-07-10 ENCOUNTER — Ambulatory Visit (INDEPENDENT_AMBULATORY_CARE_PROVIDER_SITE_OTHER): Payer: Medicaid Other | Admitting: Nurse Practitioner

## 2014-07-10 VITALS — BP 118/64 | HR 72 | Ht 64.0 in | Wt 115.0 lb

## 2014-07-10 DIAGNOSIS — I69959 Hemiplegia and hemiparesis following unspecified cerebrovascular disease affecting unspecified side: Secondary | ICD-10-CM

## 2014-07-10 DIAGNOSIS — I69354 Hemiplegia and hemiparesis following cerebral infarction affecting left non-dominant side: Secondary | ICD-10-CM

## 2014-07-10 DIAGNOSIS — I629 Nontraumatic intracranial hemorrhage, unspecified: Secondary | ICD-10-CM

## 2014-07-10 NOTE — Progress Notes (Signed)
PATIENT: Regina [name] DOB: [birth year]  REASON FOR VISIT: hospital follow up for stroke HISTORY FROM: patient  HISTORY OF PRESENT ILLNESS: Regina [name] is a 48 y.o. female who comes to the office for first hospital follow up post hospital discharge for stroke. She has a history of MVP, who presened with left-sided weakness on 03/23/14.  She states that she noticed that she was having difficulty folding clothes. Later that day when she lay down for a nap, she noticed some headache. This has since resolved. The next day she stumbled and fell again and called EMS.  She was admitted to the neuro ICU for further evaluation and treatment. No prior history of DVT, pulmonary embolism, rash, joint problems. Denies history of drug abuse or birth control pills. MRI of the brain showed a right frontal lobe parenchymal hemorrhage without evidence of an underlying etiology. 2D Echocardiogram with EF 50-55% with no source of embolus. TEE with no source of embolus. No vegetation. A vasculitis panel was performed and showed no significant findings. A CT of the chest and abdomen was performed to rule out malignancy. The patient was found to have a 15 mm nodule in the lingula most likely inflammatory given surrounding airspace disease. She was discharged home with outpatient PT ordered, she attended one session and stopped due to cost, no insurance. She has done well, with only slight headache, but high fatigue level. Mild weakness on left side. She states her cognitive processing speed is slower. Blood pressure is normal, cholesterol normal. Non-smoker, only occasional alcohol. Follow up ultrasound with OBGYN determined endometriosis, would watch and wait since not symptomatic.  REVIEW OF SYSTEMS: Full 14 system review of systems performed and notable only for: fatigue, light sensitivity, loss of vision, joint pain, neck stiffness, rash, moles, itching, memory loss, headache, confusion, anxiety.  ALLERGIES: No Known  Allergies  HOME MEDICATIONS: Outpatient Prescriptions Prior to Visit  Medication Sig Dispense Refill  . diphenhydrAMINE (BENADRYL) 25 MG tablet Take 25 mg by mouth every 6 (six) hours as needed for sleep.      Marland Kitchen LORazepam (ATIVAN) 0.5 MG tablet Take 0.5 mg by mouth every 6 (six) hours as needed for anxiety.       PHYSICAL EXAM Filed Vitals:   07/10/14 1511  BP: 118/64  Pulse: 72  Height: 5\' 4"  (1.626 m)  Weight: 115 lb (52.164 kg)   Body mass index is 19.73 kg/(m^2).  Visual Acuity Screening   Right eye Left eye Both eyes  Without correction: 20/20 20/20   With correction:      Generalized: Well developed, in no acute distress  Head: normocephalic and atraumatic. Oropharynx benign  Neck: Supple, no carotid bruits  Cardiac: Regular rate rhythm, no murmur  Musculoskeletal: No deformity   Neurological examination  Mentation: Alert oriented to time, place, history taking. Follows all commands speech and language fluent Cranial nerve II-XII: Fundoscopic exam not done. Pupils were equal round reactive to light extraocular movements were full, visual field were full on confrontational test. Facial sensation and strength were normal. hearing was intact to finger rubbing bilaterally. Uvula tongue midline. head turning and shoulder shrug and were normal and symmetric.Tongue protrusion into cheek strength was normal. Motor: The motor testing reveals 5 over 5 strength of all 4 extremities. Good symmetric motor tone is noted throughout.  Sensory: Sensory testing is intact to soft touch on all 4 extremities. No evidence of extinction is noted.  Coordination: Cerebellar testing reveals good finger-nose-finger and heel-to-shin bilaterally.  Gait  and station: Gait is normal. Tandem gait is normal. Romberg is negative. Reflexes: Deep tendon reflexes are symmetric and normal bilaterally.  NIHSS: 0 MRs: 1  DIAGNOSTIC DATA (LABS, IMAGING, TESTING) - I reviewed patient records, labs, notes, testing  and imaging myself where available.  Lab Results  Component Value Date   WBC 11.2* 03/26/2014   HGB 17.6* 03/26/2014   HCT 52.9* 03/26/2014   MCV 97.6 03/26/2014   PLT 553* 03/26/2014      Component Value Date/Time   NA 142 03/25/2014 0550   K 3.6* 03/25/2014 0550   CL 105 03/25/2014 0550   CO2 24 03/25/2014 0550   GLUCOSE 84 03/25/2014 0550   BUN 8 03/25/2014 0550   CREATININE 0.58 03/25/2014 0550   CALCIUM 8.1* 03/25/2014 0550   PROT 6.4 03/23/2014 2052   ALBUMIN 3.6 03/23/2014 2052   AST 15 03/23/2014 2052   ALT 11 03/23/2014 2052   ALKPHOS 71 03/23/2014 2052   BILITOT 0.4 03/23/2014 2052   GFRNONAA >90 03/25/2014 0550   GFRAA >90 03/25/2014 0550   ASSESSMENT: 48 y.o. 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 Patient with no vascular risk factors. Patient with residual mild left hemiparesis and fatigue, mild cognitive slowing.  PLAN: I had a long discussion with the patient and daughter regarding her recent stroke, discussed results of evaluation in the hospital and answered questions. No anticoagulants or anti-thrombotics secondary to hemorrhage for secondary stroke prevention. Repeat MRI Brain with and without contrast to evaluate resolution of hemorrhage. Maintain strict control of hypertension with blood pressure goal below 130/90, and lipids with LDL cholesterol goal below 100 mg/dL. Followup in the future with Dr. Leonie Man or Erlinda Hong in 4 months, sooner as needed.  Rudi Rummage Hamzah Savoca, MSN, FNP-BC, A/GNP-C 07/10/2014, 3:33 PM [facility] [address] [place], [place] [address] [phone]  Note: This document was prepared with digital dictation and possible smart phrase technology. Any transcriptional errors that result from this process are unintentional.

## 2014-07-10 NOTE — Patient Instructions (Addendum)
Repeat MRI of the Brain with and Without Contrast, someone will call you to schedule this. If results are as expected, you will not need further followup imaging.  Maintain strict control of hypertension with blood pressure goal below 130/90, and lipids with LDL cholesterol goal below 100 mg/dL.   Followup in the future with Dr. Erlinda Hong in 4 months, sooner as needed.   Hemorrhagic Stroke A hemorrhagic stroke occurs when a blood vessel in the brain leaks or bursts. Areas of the brain that should receive blood, oxygen, and nutrients from the damaged blood vessel are deprived of blood flow. This causes areas of the brain to become damaged. Damage also occurs to areas of the brain where the leaked blood accumulates and presses on normal tissue. This is a medical emergency. This can cause permanent damage and loss of brain function. CAUSES  A hemorrhagic stroke is caused by a decrease of oxygen supply to an area of your brain. It is the result of a blood vessel that leaks or ruptures. The leaking or rupturing blood vessel occurs due to one of the following conditions:  A ballooning of a weak section in a blood vessel (aneurysm).  Hardened, thin blood vessels. Blood vessel walls lined with plaque becoming thin and hardened. These hardened, thin artery walls can crack open and allow blood to flow out of the blood vessel.  An abnormal formation of a blood vessel (arteriovenous malformation). This condition results in an abnormal tangle of thin-walled blood vessels. Once the blood vessel ruptures, bleeding occurs. The blood from the ruptured blood vessel accumulates and compresses the surrounding brain tissue. Hemorrhagic strokes are classified as to the location of the bleed. If the bleeding occurs within the brain tissue, the condition is called an intracerebral hemorrhage. If the bleeding occurs in the area between the brain and the thin tissues that cover the brain, the condition is called a subarachnoid  hemorrhage.  RISK FACTORS  High blood pressure (hypertension).  Abnormal blood vessels present since birth.  Bleeding disorders, such as hemophilia, sickle cell disease, or liver disease.  The blood becoming too thin while taking blood thinners (anticoagulants).  Smoking.  Excessive alcohol use.  Use of illicit drugs (especially cocaine or methamphetamine). SYMPTOMS   Sudden, severe headache with no known cause. The headache is often described as the worst headache ever experienced.  Nausea or vomiting, especially when combined with other symptoms such as a headache.  Sudden weakness or numbness of the face, arm, or leg, especially on one side of the body.  Sudden trouble walking or difficulty moving the legs.  Sudden confusion.  Sudden personality changes.  Trouble speaking (aphasia) or understanding.  Difficulty swallowing.  Sudden trouble seeing in one or both eyes.  Double vision.  Dizziness.  Loss of balance or coordination.  Intolerance to light.  Stiff neck. DIAGNOSIS  Your health care provider will often suspect a hemorrhagic stroke based on your symptoms, history, and exam. A CT scan of the brain is usually performed. This is done to confirm the presence of bleeding in the brain, to look for causes, and to determine severity. Other tests may be done, including:  An MRI.  Angiography.  Blood tests. TREATMENT  The goals of treatment are to try to stop the bleeding, control pressure in the brain, and relieve symptoms.  Medicines may be given to:  Lower blood pressure (antihypertensives).  Relieve pain (analgesics).  Relieve nausea or vomiting.  Stop or prevent seizures.  Prevent the blood vessels  in the brain from going into spasm in response to the presence of bleeding.  Other medicines, blood products, or vitamin K may also be given to control the bleeding.  If there is a collection of blood putting pressure on your brain, or if the blood  vessel continues to bleed, surgery may be required.  Surgery may also be needed if tests reveal that there are other problems within the blood vessels of the brain that put you at an elevated risk for another bleeding event in the future. Further treatment depends on the duration, severity, and cause of your symptoms. Physical, speech, and occupational therapists will assess you and work to improve any functions impaired by the stroke. Measures will be taken to prevent short-term and long-term complications, including infection from breathing foreign material into the lungs (aspiration pneumonia), blood clots in the legs, bedsores, and falls. HOME CARE INSTRUCTIONS   Take medicines only as instructed by your health care provider.  If swallow studies have determined that your swallowing reflex is present, you should eat healthy foods. Including 5 or more servings of fruits and vegetables a day may reduce the risk of stroke. Foods may need to be a certain consistency (soft or pureed), or small bites may need to be taken in order to avoid aspirating or choking. Certain dietary changes may be advised to address high blood pressure, high cholesterol, diabetes, or obesity.  Food choices that are low in sodium, saturated fat, trans fat, and cholesterol are recommended to manage high blood pressure.  Food choies that are high in fiber, and low in saturated fat, trans fat, and cholesterol may control cholesterol levels.  Controlling carbohydrates and sugar intake is recommended to manage diabetes.  Reducing calorie intake and making food choices that are low in sodium, saturated fat, trans fat, and cholesterol are recommended to manage obesity.  Maintain a healthy weight.  Stay physically active. It is recommended that you get at least 30 minutes of activity on most or all days.  Limit alcohol use. Moderate alcohol use is considered to be:  No more than 2 drinks each day for men.  No more than 1  drink each day for nonpregnant women.  Stop drug abuse.  Manage any other health care conditions you may have, if applicable.  A safe home environment is important to reduce the risk of falls. Your health care provider may arrange for specialists to evaluate your home. Having grab bars in the bedroom and bathroom is often important. Your health care provider may arrange for special equipment to be used at home, such as raised toilets and a seat for the shower.  Physical, occupational, and speech therapy. Ongoing therapy may be needed to maximize your recovery after a stroke. If you have been advised to use a walker or a cane, use it at all times. Be sure to keep your therapy appointments.  Follow all instructions for follow-up with your health care provider. This is very important. This includes any referrals, physical therapy, rehabilitation, and laboratory tests. Proper follow-up can prevent another stroke from occurring. SEEK MEDICAL CARE IF:  You have personality changes.  You have difficulty swallowing.  You are seeing double.  You have dizziness.  You have a fever.  You have skin breakdown. SEEK IMMEDIATE MEDICAL CARE IF:   You have a sudden, severe headache with no known cause.  You have sudden nausea or vomiting with a severe headache.  You have sudden weakness or numbness of the face, arm,  or leg, especially on one side of the body.  You have sudden trouble walking or difficulty moving arms or legs.  You have sudden confusion.  You have trouble speaking (aphasia) or understanding.  You have sudden trouble seeing in one or both eyes.  You have a sudden loss of balance or coordination.  You have a stiff neck.  You have difficulty breathing.  You have a partial or total loss of consciousness. Any of these symptoms may represent a serious problem that is an emergency. Do not wait to see if the symptoms will go away. Get medical help at once. Call your local  emergency services (911 in [place] Do not drive yourself to the hospital. Document Released: 04/27/2010 Document Revised: 03/24/2014 Document Reviewed: 06/17/2012 [facility] Patient Information 2015 [place], [place]. This information is not intended to replace advice given to you by your health care provider. Make sure you discuss any questions you have with your health care provider.

## 2014-07-14 ENCOUNTER — Ambulatory Visit (HOSPITAL_COMMUNITY): Payer: MEDICAID

## 2014-07-14 NOTE — Progress Notes (Signed)
I agree with the above plan 

## 2014-07-21 ENCOUNTER — Ambulatory Visit
Admission: RE | Admit: 2014-07-21 | Discharge: 2014-07-21 | Disposition: A | Discharge status: Home / Self Care | Payer: Medicaid Other | Source: Ambulatory Visit | Attending: Nurse Practitioner | Admitting: Nurse Practitioner

## 2014-07-21 DIAGNOSIS — I619 Nontraumatic intracerebral hemorrhage, unspecified: Secondary | ICD-10-CM

## 2014-07-21 DIAGNOSIS — I629 Nontraumatic intracranial hemorrhage, unspecified: Secondary | ICD-10-CM

## 2014-07-21 MED ORDER — GADOBENATE DIMEGLUMINE 529 MG/ML IV SOLN
11.0000 mL | Freq: Once | INTRAVENOUS | Status: AC | PRN
  Administered 2014-07-21: 11 mL via INTRAVENOUS

## 2014-07-22 ENCOUNTER — Other Ambulatory Visit: Payer: Medicaid Other

## 2014-07-23 ENCOUNTER — Ambulatory Visit (HOSPITAL_COMMUNITY)
Admission: RE | Admit: 2014-07-23 | Discharge: 2014-07-23 | Disposition: A | Discharge status: Home / Self Care | Payer: Medicaid Other | Source: Ambulatory Visit | Attending: Family Medicine | Admitting: Family Medicine

## 2014-07-23 DIAGNOSIS — N838 Other noninflammatory disorders of ovary, fallopian tube and broad ligament: Secondary | ICD-10-CM | POA: Diagnosis not present

## 2014-08-15 ENCOUNTER — Other Ambulatory Visit (HOSPITAL_COMMUNITY): Payer: Self-pay | Admitting: Interventional Radiology

## 2014-08-15 ENCOUNTER — Telehealth (HOSPITAL_COMMUNITY): Payer: Self-pay | Admitting: Interventional Radiology

## 2014-08-15 DIAGNOSIS — I609 Nontraumatic subarachnoid hemorrhage, unspecified: Secondary | ICD-10-CM

## 2014-08-15 NOTE — Telephone Encounter (Signed)
Called pt, left VM for her to call to schedule f/u angio JM 

## 2014-08-21 ENCOUNTER — Telehealth (HOSPITAL_COMMUNITY): Payer: Self-pay | Admitting: Interventional Radiology

## 2014-08-21 NOTE — Telephone Encounter (Signed)
Called pt, left VM for her to call to schedule her cerebral angio JM

## 2014-09-11 ENCOUNTER — Telehealth (HOSPITAL_COMMUNITY): Payer: Self-pay | Admitting: Interventional Radiology

## 2014-09-11 NOTE — Telephone Encounter (Signed)
Called pt, left VM for her to call to schedule f/u angio JM 

## 2014-09-22 ENCOUNTER — Encounter: Payer: Self-pay | Admitting: Nurse Practitioner

## 2014-10-01 ENCOUNTER — Telehealth (HOSPITAL_COMMUNITY): Payer: Self-pay | Admitting: Interventional Radiology

## 2014-10-01 NOTE — Telephone Encounter (Signed)
Regina [name] called pt, left VM for her to call to schedule f/u angio JM

## 2014-10-06 ENCOUNTER — Telehealth: Payer: Self-pay | Admitting: Neurology

## 2014-10-06 NOTE — Telephone Encounter (Signed)
Patient states that she has had a headache, and crook in her neck for several days, states that she took some tylenol which didn't help she took 600 mg IBU, which did help, states that she believes that her headache may be related to sinuses, please advise.

## 2014-10-06 NOTE — Telephone Encounter (Signed)
Pt states that she wants to speak with a nurse regarding how she is feeling.  She has had a mild headache and a crook in her neck again.  The last time this happened is when she had her stroke.  Her PCP told her to touch base with her neurologist.  Please call and advise. She states she does not feel well it's not painful though.

## 2014-10-07 ENCOUNTER — Other Ambulatory Visit: Payer: Self-pay | Admitting: Nurse Practitioner

## 2014-10-07 DIAGNOSIS — M436 Torticollis: Secondary | ICD-10-CM

## 2014-10-07 DIAGNOSIS — I69354 Hemiplegia and hemiparesis following cerebral infarction affecting left non-dominant side: Secondary | ICD-10-CM

## 2014-10-07 MED ORDER — TIZANIDINE HCL 2 MG PO TABS: 2.0000 mg | ORAL_TABLET | Freq: Three times a day (TID) | ORAL | Status: DC | PRN

## 2014-10-07 NOTE — Telephone Encounter (Signed)
Headache may be related to stiff neck, I can send an Rx for a muscle relaxer if she would like. I will send in Tizanidine, 2 mg, may take 1 every 6-8 hours as needed for stiiff neck/muscle spasm.

## 2014-10-07 NOTE — Telephone Encounter (Signed)
Called patient and left message informing her that headache may be related to stiff neck and that medication Tizanidine 2 mg has been called into her pharmacy to take 1 every 6-8 hours as needed for stiff neck and muscle spasms.

## 2014-10-21 ENCOUNTER — Telehealth: Payer: Self-pay | Admitting: Neurology

## 2014-10-21 NOTE — Telephone Encounter (Signed)
Patient wanting to know if she can donate plasma, please advise.

## 2014-10-21 NOTE — Telephone Encounter (Signed)
Pt is calling wanting to know if it is ok to donate Plasma or not.  Not sure if she can because she had a stroke back in May.  Please call and advise.

## 2014-10-22 NOTE — Telephone Encounter (Signed)
I see no issues with having a stroke and donating plasma.kindly inform patient

## 2014-10-23 ENCOUNTER — Encounter: Payer: Self-pay | Admitting: *Deleted

## 2014-10-23 NOTE — Telephone Encounter (Signed)
Unable to reach patient by phone, letter has been mailed.

## 2015-01-28 ENCOUNTER — Other Ambulatory Visit (HOSPITAL_COMMUNITY): Payer: Self-pay | Admitting: Interventional Radiology

## 2015-01-28 DIAGNOSIS — I609 Nontraumatic subarachnoid hemorrhage, unspecified: Secondary | ICD-10-CM

## 2015-02-05 ENCOUNTER — Other Ambulatory Visit (HOSPITAL_COMMUNITY): Payer: Self-pay | Admitting: Family Medicine

## 2015-02-05 ENCOUNTER — Ambulatory Visit (HOSPITAL_COMMUNITY)
Admission: RE | Admit: 2015-02-05 | Discharge: 2015-02-05 | Disposition: A | Discharge status: Home / Self Care | Payer: Disability Insurance | Source: Ambulatory Visit | Attending: Family Medicine | Admitting: Family Medicine

## 2015-02-05 DIAGNOSIS — M25512 Pain in left shoulder: Secondary | ICD-10-CM

## 2015-02-05 DIAGNOSIS — Z8673 Personal history of transient ischemic attack (TIA), and cerebral infarction without residual deficits: Secondary | ICD-10-CM | POA: Diagnosis not present

## 2015-02-05 DIAGNOSIS — M25552 Pain in left hip: Secondary | ICD-10-CM

## 2015-02-05 DIAGNOSIS — W19XXXA Unspecified fall, initial encounter: Secondary | ICD-10-CM | POA: Diagnosis not present

## 2015-02-05 DIAGNOSIS — M25562 Pain in left knee: Secondary | ICD-10-CM | POA: Diagnosis not present

## 2015-02-11 ENCOUNTER — Other Ambulatory Visit: Payer: Self-pay | Admitting: Radiology

## 2015-02-12 ENCOUNTER — Other Ambulatory Visit: Payer: Self-pay | Admitting: Radiology

## 2015-02-13 ENCOUNTER — Other Ambulatory Visit (HOSPITAL_COMMUNITY): Payer: Medicaid Other

## 2015-02-13 ENCOUNTER — Ambulatory Visit (HOSPITAL_COMMUNITY)
Admission: RE | Admit: 2015-02-13 | Discharge: 2015-02-13 | Disposition: A | Discharge status: Home / Self Care | Payer: Medicaid Other | Source: Ambulatory Visit | Attending: Interventional Radiology | Admitting: Interventional Radiology

## 2015-02-13 ENCOUNTER — Encounter (HOSPITAL_COMMUNITY): Payer: Self-pay

## 2015-02-13 ENCOUNTER — Other Ambulatory Visit (HOSPITAL_COMMUNITY): Payer: Self-pay | Admitting: Interventional Radiology

## 2015-02-13 DIAGNOSIS — I69254 Hemiplegia and hemiparesis following other nontraumatic intracranial hemorrhage affecting left non-dominant side: Secondary | ICD-10-CM | POA: Diagnosis not present

## 2015-02-13 DIAGNOSIS — I612 Nontraumatic intracerebral hemorrhage in hemisphere, unspecified: Secondary | ICD-10-CM | POA: Diagnosis present

## 2015-02-13 DIAGNOSIS — I609 Nontraumatic subarachnoid hemorrhage, unspecified: Secondary | ICD-10-CM

## 2015-02-13 DIAGNOSIS — Z8249 Family history of ischemic heart disease and other diseases of the circulatory system: Secondary | ICD-10-CM | POA: Diagnosis not present

## 2015-02-13 DIAGNOSIS — I341 Nonrheumatic mitral (valve) prolapse: Secondary | ICD-10-CM | POA: Diagnosis not present

## 2015-02-13 LAB — CBC WITH DIFFERENTIAL/PLATELET
Basophils Absolute: 0.1 10*3/uL (ref 0.0–0.1)
Basophils Relative: 2 % — ABNORMAL HIGH (ref 0–1)
EOS PCT: 2 % (ref 0–5)
Eosinophils Absolute: 0.2 10*3/uL (ref 0.0–0.7)
HCT: 59.3 % — ABNORMAL HIGH (ref 36.0–46.0)
Hemoglobin: 19.8 g/dL — ABNORMAL HIGH (ref 12.0–15.0)
Lymphocytes Relative: 13 % (ref 12–46)
Lymphs Abs: 1.2 10*3/uL (ref 0.7–4.0)
MCH: 30.6 pg (ref 26.0–34.0)
MCHC: 33.4 g/dL (ref 30.0–36.0)
MCV: 91.7 fL (ref 78.0–100.0)
Monocytes Absolute: 0.3 10*3/uL (ref 0.1–1.0)
Monocytes Relative: 4 % (ref 3–12)
Neutro Abs: 7.6 10*3/uL (ref 1.7–7.7)
Neutrophils Relative %: 80 % — ABNORMAL HIGH (ref 43–77)
Platelets: 434 10*3/uL — ABNORMAL HIGH (ref 150–400)
RBC: 6.47 MIL/uL — ABNORMAL HIGH (ref 3.87–5.11)
RDW: 15.4 % (ref 11.5–15.5)
WBC: 9.5 10*3/uL (ref 4.0–10.5)

## 2015-02-13 LAB — BASIC METABOLIC PANEL
ANION GAP: 6 (ref 5–15)
BUN: 16 mg/dL (ref 6–23)
CHLORIDE: 106 mmol/L (ref 96–112)
CO2: 28 mmol/L (ref 19–32)
CREATININE: 0.73 mg/dL (ref 0.50–1.10)
Calcium: 9.2 mg/dL (ref 8.4–10.5)
GFR calc Af Amer: >90 mL/min (ref >90)
GFR calc non Af Amer: >90 mL/min (ref >90)
Glucose, Bld: 92 mg/dL (ref 70–99)
Potassium: 4.3 mmol/L (ref 3.5–5.1)
Sodium: 140 mmol/L (ref 135–145)

## 2015-02-13 MED ORDER — FENTANYL CITRATE 0.05 MG/ML IJ SOLN
INTRAMUSCULAR | Status: AC
  Filled 2015-02-13: qty 2

## 2015-02-13 MED ORDER — SODIUM CHLORIDE 0.9 % IV SOLN
INTRAVENOUS | Status: AC
Start: 2015-02-13 — End: 2015-02-13

## 2015-02-13 MED ORDER — HEPARIN SOD (PORK) LOCK FLUSH 100 UNIT/ML IV SOLN
INTRAVENOUS | Status: AC
  Filled 2015-02-13: qty 30

## 2015-02-13 MED ORDER — MIDAZOLAM HCL 2 MG/2ML IJ SOLN
INTRAMUSCULAR | Status: DC | PRN
  Administered 2015-02-13: 1 mg via INTRAVENOUS

## 2015-02-13 MED ORDER — SODIUM CHLORIDE 0.9 % IV SOLN
INTRAVENOUS | Status: DC
  Administered 2015-02-13: 09:00:00 via INTRAVENOUS

## 2015-02-13 MED ORDER — LIDOCAINE HCL 1 % IJ SOLN
INTRAMUSCULAR | Status: AC
  Filled 2015-02-13: qty 20

## 2015-02-13 MED ORDER — IOHEXOL 300 MG/ML  SOLN
150.0000 mL | Freq: Once | INTRAMUSCULAR | Status: AC | PRN
  Administered 2015-02-13: 1 mL via INTRA_ARTERIAL

## 2015-02-13 MED ORDER — HEPARIN SODIUM (PORCINE) 1000 UNIT/ML IJ SOLN
INTRAMUSCULAR | Status: DC | PRN
  Administered 2015-02-13: 1000 [IU] via INTRAVENOUS

## 2015-02-13 MED ORDER — MIDAZOLAM HCL 2 MG/2ML IJ SOLN
INTRAMUSCULAR | Status: AC
  Filled 2015-02-13: qty 2

## 2015-02-13 MED ORDER — FENTANYL CITRATE 0.05 MG/ML IJ SOLN
INTRAMUSCULAR | Status: DC | PRN
  Administered 2015-02-13: 50 ug via INTRAVENOUS

## 2015-02-13 NOTE — Discharge Instructions (Signed)

## 2015-02-13 NOTE — Procedures (Signed)
S/P 4 vessel cerebral arteriogram. RT CFA approach. Findings. No occlusions,stenosis,or dissections or AV shunting seen. Venous outflow WNLs

## 2015-02-13 NOTE — Consult Note (Signed)
Chief Complaint: Previous CVA  Referring Physician(s): Dr Leonie Man  History of Present Illness: Regina [name] is a 49 y.o. female   Hx CVA Intracranial hemorrhage 03/2014 No intervention performed at that time   TEE negative Pt has done well Therapy has proven effective Still feels Left arm sl weak No other complaints Scheduled now for follow up cerebral arteriogram   Past Medical History  Diagnosis Date  . Mitral valve prolapse   . Stroke 03/22/2014    Past Surgical History  Procedure Laterality Date  . Root canal    . Tee without cardioversion N/A 03/26/2014    Procedure: TRANSESOPHAGEAL ECHOCARDIOGRAM (TEE);  Surgeon: Larey Dresser, MD;  Location[facility] ENDOSCOPY;  Service: Cardiovascular;  Laterality: N/A;  . Wisdom tooth extraction      Allergies: Review of patient's allergies indicates no known allergies.  Medications: Prior to Admission medications   Medication Sig Start Date End Date Taking? Authorizing Provider  acetaminophen (TYLENOL) 325 MG tablet Take 650 mg by mouth as needed for headache.    Yes Historical Provider, MD  cetirizine (ZYRTEC) 10 MG tablet Take 10 mg by mouth daily as needed for allergies.   Yes Historical Provider, MD  diphenhydrAMINE (BENADRYL) 25 MG tablet Take 25 mg by mouth every 6 (six) hours as needed for allergies or sleep.    Yes Historical Provider, MD  ibuprofen (ADVIL,MOTRIN) 200 MG tablet Take 200 mg by mouth as needed for headache.    Yes Historical Provider, MD  LORazepam (ATIVAN) 0.5 MG tablet Take 0.5 mg by mouth every 6 (six) hours as needed for anxiety.   Yes Historical Provider, MD  Magnesium 250 MG TABS Take by mouth daily.   Yes Historical Provider, MD  tiZANidine (ZANAFLEX) 2 MG tablet Take 1 tablet (2 mg total) by mouth every 8 (eight) hours as needed. Patient taking differently: Take 2 mg by mouth every 8 (eight) hours as needed for muscle spasms.  10/07/14  Yes Philmore Pali, NP     Family History  Problem Relation  Age of Onset  . Hyperlipidemia Mother   . Hypertension Father   . Cancer Father     kidney  . Asthma Daughter     exercise induced  . Heart murmur Son   . Migraines Son   . Diabetes Maternal Grandmother   . Heart disease Maternal Grandmother   . Heart attack Maternal Grandfather   . Heart disease Maternal Grandfather     History   Social History  . Marital Status: Legally Separated    Spouse Name: Audry Pili  . Number of Children: 4  . Years of Education: college   Occupational History  . other    Social History Main Topics  . Smoking status: Never Smoker   . Smokeless tobacco: Never Used  . Alcohol Use: Yes     Comment: rarely per pt  . Drug Use: No  . Sexual Activity: Not Currently   Other Topics Concern  . None   Social History Narrative     Review of Systems: A 12 point ROS discussed and pertinent positives are indicated in the HPI above.  All other systems are negative.  Review of Systems  Constitutional: Negative for activity change, appetite change, fatigue and unexpected weight change.  HENT: Negative for tinnitus, trouble swallowing and voice change.   Eyes: Negative for photophobia and visual disturbance.  Respiratory: Negative for cough and shortness of breath.   Cardiovascular: Negative for chest pain.  Gastrointestinal: Negative for nausea, vomiting and abdominal pain.  Genitourinary: Negative for difficulty urinating.  Musculoskeletal: Negative for back pain.  Neurological: Positive for weakness. Negative for dizziness, tremors, seizures, syncope, facial asymmetry, speech difficulty, light-headedness, numbness and headaches.       Minimal Left arm weakness  Psychiatric/Behavioral: Negative for behavioral problems, confusion, decreased concentration and agitation.    Vital Signs: Pulse 65  Temp(Src) 97.8 F (36.6 C)  Resp 20  Ht 5\' 4"  (1.626 m)  Wt 53.524 kg (118 lb)  BMI 20.24 kg/m2  SpO2 97%  LMP 02/01/2015  Physical Exam  Constitutional:  She is oriented to person, place, and time. She appears well-nourished.  Eyes: EOM are normal.  Neck: Normal range of motion.  Cardiovascular: Normal rate, regular rhythm and normal heart sounds.   No murmur heard. Pulmonary/Chest: Effort normal and breath sounds normal. She has no wheezes.  Abdominal: Soft. Bowel sounds are normal. There is no tenderness.  Musculoskeletal: Normal range of motion.  Neurological: She is alert and oriented to person, place, and time.  Skin: Skin is warm and dry.  Psychiatric: She has a normal mood and affect. Her behavior is normal. Judgment and thought content normal.  Nursing note and vitals reviewed.   Mallampati Score:  MD Evaluation Airway: WNL Heart: WNL Abdomen: WNL Chest/ Lungs: WNL ASA  Classification: 2 Mallampati/Airway Score: One  Imaging: Dg Shoulder Left  02/05/2015   CLINICAL DATA:  Left shoulder pain and weakness. The patient had a stroke and fell into the dishwasher in 2015.  EXAM: LEFT SHOULDER - 2+ VIEW  COMPARISON:  None.  FINDINGS: There is no evidence of fracture or dislocation. There is no evidence of arthropathy or other focal bone abnormality. Soft tissues are unremarkable.  IMPRESSION: Normal examination.   Electronically Signed   By: Claudie Revering M.D.   On: 02/05/2015 15:25   Dg Knee Complete 4 Views Left  02/05/2015   CLINICAL DATA:  Left knee pain. Had a stroke and fell into the dishwasher in 2015.  EXAM: LEFT KNEE - COMPLETE 4+ VIEW  COMPARISON:  None.  FINDINGS: There is no evidence of fracture, dislocation, or joint effusion. There is no evidence of arthropathy or other focal bone abnormality. Soft tissues are unremarkable.  IMPRESSION: Normal examination.   Electronically Signed   By: Claudie Revering M.D.   On: 02/05/2015 15:27   Dg Hip Unilat With Pelvis 2-3 Views Left  02/05/2015   CLINICAL DATA:  Left hip pain. Had a stroke in fell into the dishwasher in 2015.  EXAM: LEFT HIP (WITH PELVIS) 2-3 VIEWS  COMPARISON:  None.   FINDINGS: Normal appearing hips, pelvis and lower lumbar spine. No fracture, dislocation or degenerative changes.  IMPRESSION: Normal examination.   Electronically Signed   By: Claudie Revering M.D.   On: 02/05/2015 15:26    Labs:  CBC:  Recent Labs  03/23/14 2052 03/25/14 0550 03/26/14 0516  WBC 12.4* 11.4* 11.2*  HGB 18.0* 17.1* 17.6*  HCT 53.6* 51.1* 52.9*  PLT 584* 514* 553*    COAGS:  Recent Labs  03/23/14 2052  INR 1.06  APTT 33    BMP:  Recent Labs  03/23/14 2052 03/25/14 0550  NA 141 142  K 4.2 3.6*  CL 104 105  CO2 27 24  GLUCOSE 103* 84  BUN 17 8  CALCIUM 8.7 8.1*  CREATININE 0.58 0.58  GFRNONAA >90 >90  GFRAA >90 >90    LIVER FUNCTION TESTS:  Recent Labs  03/23/14 2052  BILITOT 0.4  AST 15  ALT 11  ALKPHOS 71  PROT 6.4  ALBUMIN 3.6    TUMOR MARKERS: No results for input(s): AFPTM, CEA, CA199, CHROMGRNA in the last 8760 hours.  Assessment and Plan:  03/2014 pt was admitted with complaints of L weakness; collapse at home Found to have R frontal ICH TEE neg Doing well; with minimal residual L arm weakness Now for follow up cerebral arteriogram Pt is aware of procedure benefits and risks including but not limited to: Infection; bleeding; CVA; death  Thank you for this interesting consult.  I greatly enjoyed meeting Regina [name] and look forward to participating in their care.  Signed: Rache Klimaszewski A 02/13/2015, 9:23 AM   I spent a total of  20 Minutes   in face to face in clinical consultation, greater than 50% of which was counseling/coordinating care for cerebral arteriogram

## 2015-03-18 ENCOUNTER — Encounter: Payer: Self-pay | Admitting: Neurology

## 2015-03-18 ENCOUNTER — Ambulatory Visit (INDEPENDENT_AMBULATORY_CARE_PROVIDER_SITE_OTHER): Payer: Medicaid Other | Admitting: Neurology

## 2015-03-18 VITALS — BP 120/75 | HR 79 | Wt 118.6 lb

## 2015-03-18 DIAGNOSIS — I611 Nontraumatic intracerebral hemorrhage in hemisphere, cortical: Secondary | ICD-10-CM

## 2015-03-18 NOTE — Progress Notes (Signed)
PATIENT: Regina [name] DOB: [birth year]  REASON FOR VISIT: hospital follow up for stroke HISTORY FROM: patient  HISTORY OF PRESENT ILLNESS: Regina [name] is a 49 y.o. female who comes to the office for first hospital follow up post hospital discharge for stroke. She has a history of MVP, who presened with left-sided weakness on 03/23/14.  She states that she noticed that she was having difficulty folding clothes. Later that day when she lay down for a nap, she noticed some headache. This has since resolved. The next day she stumbled and fell again and called EMS.  She was admitted to the neuro ICU for further evaluation and treatment. No prior history of DVT, pulmonary embolism, rash, joint problems. Denies history of drug abuse or birth control pills. MRI of the brain showed a right frontal lobe parenchymal hemorrhage without evidence of an underlying etiology. 2D Echocardiogram with EF 50-55% with no source of embolus. TEE with no source of embolus. No vegetation. A vasculitis panel was performed and showed no significant findings. A CT of the chest and abdomen was performed to rule out malignancy. The patient was found to have a 15 mm nodule in the lingula most likely inflammatory given surrounding airspace disease. She was discharged home with outpatient PT ordered, she attended one session and stopped due to cost, no insurance. She has done well, with only slight headache, but high fatigue level. Mild weakness on left side. She states her cognitive processing speed is slower. Blood pressure is normal, cholesterol normal. Non-smoker, only occasional alcohol. Follow up ultrasound with OBGYN determined endometriosis, would watch and wait since not symptomatic. Update 03/18/2015 : she returns for follow-up after last visit 8 months ago. She continues to do well without recurrent neurological symptoms. She had outpatient cerebral catheter angiogram done on 02/13/14 by Dr. Estanislado Pandy which I personally  reviewed shows no evidence of intracranial aneurysm, AVM or vascular abnormality. The patient states she's made full recovery and has no neurological deficits. She does admit to getting anxious and stressed out easily. She takes Ativan when necessary as well as a shrug on the lobe as needed. 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 She had repeat MRI scan of the brain done on 07/21/14 which I have reviewed and showed expected evolutionary changes on the right frontal parenchymal hemorrhage without any underlying mass effect. A small incidental cerebellar venous angioma was noted. REVIEW OF SYSTEMS: Full 14 system review of systems performed and notable only for: fatigue, chest pain, eye redness, headache, numbness, joint pain, anxiety, nervousness, rash and all other systems negative ALLERGIES: No Known Allergies  HOME MEDICATIONS: Outpatient Prescriptions Prior to Visit  Medication Sig Dispense Refill  . diphenhydrAMINE (BENADRYL) 25 MG tablet Take 25 mg by mouth every 6 (six) hours as needed for sleep.      Marland Kitchen LORazepam (ATIVAN) 0.5 MG tablet Take 0.5 mg by mouth every 6 (six) hours as needed for anxiety.       PHYSICAL EXAM Filed Vitals:   03/18/15 1439  BP: 120/75  Pulse: 79  Weight: 118 lb 9.6 oz (53.797 kg)   Body mass index is 20.35 kg/(m^2). No exam data present Generalized: Well developed, in no acute distress  Head: normocephalic and atraumatic. Oropharynx benign  Neck: Supple, no carotid bruits  Cardiac: Regular rate rhythm, no murmur  Musculoskeletal: No deformity   Neurological examination  Mentation: Alert oriented to time, place, history taking. Follows all commands speech  and language fluent.Mini-Mental status exam scored 29/30 with only one deficit and attention and calculation. Geriatric depression scale 1 not depressed. Clock drawing 4/4. Animal naming test 14 which is normal. Cranial nerve II-XII: Fundoscopic exam not done. Pupils  were equal round reactive to light extraocular movements were full, visual field were full on confrontational test. Facial sensation and strength were normal. hearing was intact to finger rubbing bilaterally. Uvula tongue midline. head turning and shoulder shrug and were normal and symmetric.Tongue protrusion into cheek strength was normal. Motor: The motor testing reveals 5 over 5 strength of all 4 extremities. Good symmetric motor tone is noted throughout.  Sensory: Sensory testing is intact to soft touch on all 4 extremities. No evidence of extinction is noted.  Coordination: Cerebellar testing reveals good finger-nose-finger and heel-to-shin bilaterally.  Gait and station: Gait is normal. Tandem gait is normal. Romberg is negative. Reflexes: Deep tendon reflexes are symmetric and normal bilaterally.  NIHSS: 0 MRs: 1  DIAGNOSTIC DATA (LABS, IMAGING, TESTING) - I reviewed patient records, labs, notes, testing and imaging myself where available.  Lab Results  Component Value Date   WBC 9.5 02/13/2015   HGB 19.8* 02/13/2015   HCT 59.3* 02/13/2015   MCV 91.7 02/13/2015   PLT 434* 02/13/2015      Component Value Date/Time   NA 140 02/13/2015 0904   K 4.3 02/13/2015 0904   CL 106 02/13/2015 0904   CO2 28 02/13/2015 0904   GLUCOSE 92 02/13/2015 0904   BUN 16 02/13/2015 0904   CREATININE 0.73 02/13/2015 0904   CALCIUM 9.2 02/13/2015 0904   PROT 6.4 03/23/2014 2052   ALBUMIN 3.6 03/23/2014 2052   AST 15 03/23/2014 2052   ALT 11 03/23/2014 2052   ALKPHOS 71 03/23/2014 2052   BILITOT 0.4 03/23/2014 2052   GFRNONAA >90 02/13/2015 0904   GFRAA >90 02/13/2015 6568   ASSESSMENT: 49 y.o. year old female  has a past medical history of Mitral valve prolapse who suffered a right frontal brain hemorrhage on 03/22/2014, etiology unknown. Patient with no vascular risk factors. Patient has done well with now no residual deficits.  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 I encouraged her to watch her blood pressure closely and have a low threshold for starting antihypertensive if blood pressure goes up in the future. I encouraged her to increase participation in activities for stress relaxation like meditation, yoga and regular exercises. She was cleared to work part-time. She may return for follow-up in the future only as necessary and no routine follow-up appointment was made.  Antony Contras, MD  03/18/2015, 5:04 PM [facility] [address] [place], [place] [address] [phone]  Note: This document was prepared with digital dictation and possible smart phrase technology. Any transcriptional errors that result from this process are unintentional.

## 2015-03-18 NOTE — Patient Instructions (Signed)
I had a long discussion with the patient regarding her intracerebral hemorrhage in May 2015 of unknown etiology., reviewed MRI scan 2 and catheter angiogram have failed to reveal a culprit lesion.she has a small cerebellar venous angioma which is an incidental finding. I encouraged her to watch her blood pressure closely and have a low threshold for starting antihypertensive if blood pressure goes up in the future. I encouraged her to increase participation in activities for stress relaxation like meditation, yoga and regular exercises. She was cleared to work part-time. She may return for follow-up in the future only as necessary and no routine follow-up appointment was made.

## 2015-05-08 ENCOUNTER — Other Ambulatory Visit (HOSPITAL_COMMUNITY): Payer: Self-pay | Admitting: Nurse Practitioner

## 2015-05-08 DIAGNOSIS — Z1231 Encounter for screening mammogram for malignant neoplasm of breast: Secondary | ICD-10-CM

## 2015-05-18 ENCOUNTER — Ambulatory Visit (HOSPITAL_COMMUNITY)
Admission: RE | Admit: 2015-05-18 | Discharge: 2015-05-18 | Disposition: A | Discharge status: Home / Self Care | Payer: Medicaid Other | Source: Ambulatory Visit | Attending: Nurse Practitioner | Admitting: Nurse Practitioner

## 2015-05-18 DIAGNOSIS — Z1231 Encounter for screening mammogram for malignant neoplasm of breast: Secondary | ICD-10-CM | POA: Insufficient documentation

## 2015-05-19 ENCOUNTER — Other Ambulatory Visit: Payer: Self-pay | Admitting: Nurse Practitioner

## 2015-05-19 DIAGNOSIS — R928 Other abnormal and inconclusive findings on diagnostic imaging of breast: Secondary | ICD-10-CM

## 2015-06-04 ENCOUNTER — Ambulatory Visit
Admission: RE | Admit: 2015-06-04 | Discharge: 2015-06-04 | Disposition: A | Discharge status: Home / Self Care | Payer: Medicaid Other | Source: Ambulatory Visit | Attending: Nurse Practitioner | Admitting: Nurse Practitioner

## 2015-06-04 ENCOUNTER — Other Ambulatory Visit: Payer: Self-pay | Admitting: Nurse Practitioner

## 2015-06-04 DIAGNOSIS — R928 Other abnormal and inconclusive findings on diagnostic imaging of breast: Secondary | ICD-10-CM

## 2015-06-08 ENCOUNTER — Ambulatory Visit
Admission: RE | Admit: 2015-06-08 | Discharge: 2015-06-08 | Disposition: A | Discharge status: Home / Self Care | Payer: Medicaid Other | Source: Ambulatory Visit | Attending: Nurse Practitioner | Admitting: Nurse Practitioner

## 2015-06-08 ENCOUNTER — Other Ambulatory Visit: Payer: Self-pay | Admitting: Nurse Practitioner

## 2015-06-08 DIAGNOSIS — R928 Other abnormal and inconclusive findings on diagnostic imaging of breast: Secondary | ICD-10-CM

## 2016-08-13 LAB — BASIC METABOLIC PANEL: Glucose: 113 mg/dL

## 2016-12-04 ENCOUNTER — Emergency Department (HOSPITAL_COMMUNITY)
Admission: EM | Admit: 2016-12-04 | Discharge: 2016-12-04 | Disposition: A | Discharge status: Home / Self Care | Payer: Medicaid Other | Attending: Emergency Medicine | Admitting: Emergency Medicine

## 2016-12-04 ENCOUNTER — Encounter (HOSPITAL_COMMUNITY): Payer: Self-pay | Admitting: Emergency Medicine

## 2016-12-04 ENCOUNTER — Emergency Department (HOSPITAL_COMMUNITY): Payer: Medicaid Other

## 2016-12-04 DIAGNOSIS — Y92007 Garden or yard of unspecified non-institutional (private) residence as the place of occurrence of the external cause: Secondary | ICD-10-CM | POA: Insufficient documentation

## 2016-12-04 DIAGNOSIS — Y999 Unspecified external cause status: Secondary | ICD-10-CM | POA: Insufficient documentation

## 2016-12-04 DIAGNOSIS — Z791 Long term (current) use of non-steroidal anti-inflammatories (NSAID): Secondary | ICD-10-CM | POA: Diagnosis not present

## 2016-12-04 DIAGNOSIS — Z23 Encounter for immunization: Secondary | ICD-10-CM | POA: Diagnosis not present

## 2016-12-04 DIAGNOSIS — W108XXA Fall (on) (from) other stairs and steps, initial encounter: Secondary | ICD-10-CM | POA: Insufficient documentation

## 2016-12-04 DIAGNOSIS — S060X0A Concussion without loss of consciousness, initial encounter: Secondary | ICD-10-CM | POA: Diagnosis not present

## 2016-12-04 DIAGNOSIS — Y9389 Activity, other specified: Secondary | ICD-10-CM | POA: Insufficient documentation

## 2016-12-04 DIAGNOSIS — Z79899 Other long term (current) drug therapy: Secondary | ICD-10-CM | POA: Insufficient documentation

## 2016-12-04 DIAGNOSIS — S0990XA Unspecified injury of head, initial encounter: Secondary | ICD-10-CM | POA: Diagnosis present

## 2016-12-04 DIAGNOSIS — S0083XA Contusion of other part of head, initial encounter: Secondary | ICD-10-CM

## 2016-12-04 HISTORY — DX: Endometriosis, unspecified: N80.9

## 2016-12-04 MED ORDER — TETANUS-DIPHTH-ACELL PERTUSSIS 5-2.5-18.5 LF-MCG/0.5 IM SUSP
0.5000 mL | Freq: Once | INTRAMUSCULAR | Status: AC
  Administered 2016-12-04: 0.5 mL via INTRAMUSCULAR
  Filled 2016-12-04: qty 0.5

## 2016-12-04 MED ORDER — BACITRACIN ZINC 500 UNIT/GM EX OINT
1.0000 "application " | TOPICAL_OINTMENT | Freq: Two times a day (BID) | CUTANEOUS | Status: DC
  Administered 2016-12-04: 1 via TOPICAL
  Filled 2016-12-04: qty 0.9

## 2016-12-04 NOTE — ED Provider Notes (Signed)
[facility] DEPT Provider Note   CSN: [id number] Arrival date & time: 12/04/16  [place]     History   Chief Complaint Chief Complaint  Patient presents with  . Fall    HPI Regina [name] is a 51 y.o. female.  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 She reports that she does not take any anticoagulants in fact she takes no other medications on a daily basis other than occasional ibuprofen which she did take yesterday for some aches and pains. This evening as she was descending some stairs she tripped on the last stair, she tried to catch herself but stumbled across the yard and eventually fell into a fence post and again onto the cement ground. She did not lose consciousness but reports that she saw stars. She has mild headache, she has several hematomas and abrasions across the face, she denies any pain in her arms or her legs other than a small amount of pain around the right knee. She does not remember her last tetanus shot. The pain is constant, mild, worse with palpation, not associated with any visual difficulties, weakness or difficulty speaking. She has been able to ambulate since.  Past Medical History:  Diagnosis Date  . Endometriosis   . Mitral valve prolapse   . Stroke [facility]) 03/22/2014    Patient Active Problem List   Diagnosis Date Noted  . SAH (subarachnoid hemorrhage) ([place])   . Ovarian mass 04/25/2014  . CVA (cerebral infarction) 04/01/2014  . Hemiparesis affecting left side as late effect of stroke ([place]) 04/01/2014  . Lack of coordination 04/01/2014  . Muscle weakness (generalized) 04/01/2014  . Stroke [facility]) 03/24/2014    Past Surgical History:  Procedure Laterality Date  . ROOT CANAL    . TEE WITHOUT CARDIOVERSION N/A 03/26/2014   Procedure: TRANSESOPHAGEAL ECHOCARDIOGRAM (TEE);  Surgeon: Larey Dresser, MD;  Location: [facility] ENDOSCOPY;  Service: Cardiovascular;   Laterality: N/A;  . WISDOM TOOTH EXTRACTION      OB History    Gravida Para Term Preterm AB Living   5 4 4   1 4    SAB TAB Ectopic Multiple Live Births   1       4       Home Medications    Prior to Admission medications   Medication Sig Start Date End Date Taking? Authorizing Provider  acetaminophen (TYLENOL) 325 MG tablet Take 650 mg by mouth as needed for headache.     Historical Provider, MD  cetirizine (ZYRTEC) 10 MG tablet Take 10 mg by mouth daily as needed for allergies.    Historical Provider, MD  diphenhydrAMINE (BENADRYL) 25 MG tablet Take 25 mg by mouth every 6 (six) hours as needed for allergies or sleep.     Historical Provider, MD  ibuprofen (ADVIL,MOTRIN) 200 MG tablet Take 200 mg by mouth as needed for headache.     Historical Provider, MD  LORazepam (ATIVAN) 0.5 MG tablet Take 0.5 mg by mouth every 6 (six) hours as needed for anxiety.    Historical Provider, MD  Magnesium 250 MG TABS Take by mouth daily.    Historical Provider, MD  NON FORMULARY     Historical Provider, MD  tiZANidine (ZANAFLEX) 2 MG tablet Take 1 tablet (2 mg total) by mouth every 8 (eight) hours as needed. Patient taking differently: Take 2 mg by mouth every 8 (eight) hours as needed for muscle spasms.  10/07/14   Philmore Pali, NP    Family History Family History  Problem Relation Age of Onset  . Hyperlipidemia Mother   . Hypertension Father   . Cancer Father     kidney  . Asthma Daughter     exercise induced  . Heart murmur Son   . Migraines Son   . Diabetes Maternal Grandmother   . Heart disease Maternal Grandmother   . Heart attack Maternal Grandfather   . Heart disease Maternal Grandfather     Social History Social History  Substance Use Topics  . Smoking status: Never Smoker  . Smokeless tobacco: Never Used  . Alcohol use Yes     Comment: rarely per pt     Allergies   Patient has no known allergies.   Review of Systems Review of Systems  All other systems reviewed  and are negative.    Physical Exam Updated Vital Signs BP 141/72 (BP Location: Left Arm)   Pulse 78   Temp 97.8 F (36.6 C) (Oral)   Resp 20   Ht 5\' 4"  (1.626 m)   Wt 125 lb (56.7 kg)   LMP 02/15/2015   SpO2 100%   BMI 21.46 kg/m   Physical Exam  Constitutional: She appears well-developed and well-nourished. No distress.  HENT:  Head: Normocephalic.  Mouth/Throat: Oropharynx is clear and moist. No oropharyngeal exudate.  Hematoma to the right forehead above the right brow, smaller hematoma with abrasion over the right temporal scalp, abrasion over the right chin and the right cheek, there is no malocclusion, no hemotympanum, no raccoon eyes, no battle sign, no abnormal dentition, clear oropharynx, normal phonation  Eyes: Conjunctivae and EOM are normal. Pupils are equal, round, and reactive to light. Right eye exhibits no discharge. Left eye exhibits no discharge. No scleral icterus.  Neck: Normal range of motion. Neck supple. No JVD present. No thyromegaly present.  Cardiovascular: Normal rate, regular rhythm, normal heart sounds and intact distal pulses.  Exam reveals no gallop and no friction rub.   No murmur heard. Pulmonary/Chest: Effort normal and breath sounds normal. No respiratory distress. She has no wheezes. She has no rales.  Abdominal: Soft. Bowel sounds are normal. She exhibits no distension and no mass. There is no tenderness.  Musculoskeletal: Normal range of motion. She exhibits no edema or tenderness.  No tenderness over the cervical thoracic or lumbar spines, no tenderness over the 4 extremities with supple joints and soft compartments diffusely.  Lymphadenopathy:    She has no cervical adenopathy.  Neurological: She is alert. Coordination normal.  Skin: Skin is warm and dry. No rash noted. There is erythema.  Psychiatric: She has a normal mood and affect. Her behavior is normal.  Nursing note and vitals reviewed.    ED Treatments / Results  Labs (all  labs ordered are listed, but only abnormal results are displayed) Labs Reviewed - No data to display   Radiology Ct Head Wo Contrast  Result Date: 12/04/2016 CLINICAL DATA:  Patient c/o dizziness, headache, and right side facial/neck pain after falling down step on deck and hitting face. Per patient dizziness and blurred vision. Denies LOC. Denies any nausea or vomiting. Hematoma and abrasion to RT forehead. EXAM: CT HEAD WITHOUT CONTRAST TECHNIQUE: Contiguous axial images were obtained from the base of the skull through the vertex without intravenous contrast. COMPARISON:  03/24/2014 FINDINGS: Brain: No evidence of acute infarction, hemorrhage, hydrocephalus, extra-axial collection or mass lesion/mass effect. Vascular: No hyperdense vessel or unexpected calcification.  Skull: Normal. Negative for fracture or focal lesion. Sinuses/Orbits: Globes and orbits are unremarkable. Visualized sinuses and mastoid air cells are clear. Other: Right frontal scalp hematoma. IMPRESSION: 1. No intracranial abnormality.  No skull fracture. 2. Right frontal scalp hematoma. Electronically Signed   By: Lajean Manes M.D.   On: 12/04/2016 20:06    Procedures Procedures (including critical care time)  Medications Ordered in ED Medications  bacitracin ointment 1 application (1 application Topical Given 12/04/16 2002)  Tdap (BOOSTRIX) injection 0.5 mL (0.5 mLs Intramuscular Given 12/04/16 2002)     Initial Impression / Assessment and Plan / ED Course  I have reviewed the triage vital signs and the nursing notes.  Pertinent labs & imaging results that were available during my care of the patient were reviewed by me and considered in my medical decision making (see chart for details).  Clinical Course      I agree that the patient would benefit from a CT scan to further evaluate a potential head injury though it would be low yield given that she is not anticoagulated. He has a normal neurologic exam and no signs of  bony injury or neurologic dysfunction. We'll update tetanus, provide wound care, CT scan of the brain and anticipate discharge if negative. Certainly has been concussed.   CT scan negative for any acute intracranial pathology. No fractures, no hemorrhage. Patient stable for discharge. Wound care given, tetanus updated.  Final Clinical Impressions(s) / ED Diagnoses   Final diagnoses:  Contusion of face, initial encounter  Concussion without loss of consciousness, initial encounter    New Prescriptions New Prescriptions   No medications on file     Noemi Chapel, MD 12/04/16 2019

## 2016-12-04 NOTE — ED Triage Notes (Signed)
Patient c/o dizziness, headache, and right side facial/neck pain after falling down step on deck and hitting face. Per patient dizziness and blurred vision. Denies LOC. Denies any nausea or vomiting. Multiple abrasions noted to right side of face and neck.

## 2016-12-04 NOTE — Discharge Instructions (Signed)
Your CT scan was normal without any bleeding Motrin or tylenol for headache Antibiotic cream to the abrasions twice daily Bedrest for 2 4hours Hydrate aggressively.

## 2019-02-05 LAB — COLOGUARD: Cologuard: POSITIVE — AB

## 2019-04-16 NOTE — Progress Notes (Signed)
Unable to reach the patient by phone or text for her 10:00 am telehealth encounter today.

## 2019-04-19 ENCOUNTER — Emergency Department (HOSPITAL_COMMUNITY)
Admission: EM | Admit: 2019-04-19 | Discharge: 2019-04-19 | Disposition: A | Discharge status: Home / Self Care | Payer: PRIVATE HEALTH INSURANCE | Attending: General Surgery | Admitting: General Surgery

## 2019-04-19 ENCOUNTER — Emergency Department (HOSPITAL_COMMUNITY): Payer: PRIVATE HEALTH INSURANCE | Admitting: Anesthesiology

## 2019-04-19 ENCOUNTER — Encounter (HOSPITAL_COMMUNITY): Payer: Self-pay

## 2019-04-19 ENCOUNTER — Emergency Department (HOSPITAL_COMMUNITY): Payer: PRIVATE HEALTH INSURANCE

## 2019-04-19 ENCOUNTER — Other Ambulatory Visit: Payer: Self-pay

## 2019-04-19 ENCOUNTER — Encounter (HOSPITAL_COMMUNITY)
Admission: EM | Disposition: A | Discharge status: Home / Self Care | Payer: Self-pay | Source: Home / Self Care | Attending: Emergency Medicine

## 2019-04-19 DIAGNOSIS — F419 Anxiety disorder, unspecified: Secondary | ICD-10-CM | POA: Diagnosis not present

## 2019-04-19 DIAGNOSIS — Z79899 Other long term (current) drug therapy: Secondary | ICD-10-CM | POA: Insufficient documentation

## 2019-04-19 DIAGNOSIS — Z1159 Encounter for screening for other viral diseases: Secondary | ICD-10-CM | POA: Insufficient documentation

## 2019-04-19 DIAGNOSIS — K3580 Unspecified acute appendicitis: Secondary | ICD-10-CM

## 2019-04-19 DIAGNOSIS — Z8249 Family history of ischemic heart disease and other diseases of the circulatory system: Secondary | ICD-10-CM | POA: Insufficient documentation

## 2019-04-19 DIAGNOSIS — I341 Nonrheumatic mitral (valve) prolapse: Secondary | ICD-10-CM | POA: Diagnosis not present

## 2019-04-19 DIAGNOSIS — Z8673 Personal history of transient ischemic attack (TIA), and cerebral infarction without residual deficits: Secondary | ICD-10-CM | POA: Insufficient documentation

## 2019-04-19 HISTORY — PX: LAPAROSCOPIC APPENDECTOMY: SHX408

## 2019-04-19 HISTORY — DX: Allergy, unspecified, initial encounter: T78.40XA

## 2019-04-19 LAB — CBC WITH DIFFERENTIAL/PLATELET
Abs Immature Granulocytes: 0.08 10*3/uL — ABNORMAL HIGH (ref 0.00–0.07)
Basophils Absolute: 0.1 10*3/uL (ref 0.0–0.1)
Basophils Relative: 1 %
Eosinophils Absolute: 0 10*3/uL (ref 0.0–0.5)
Eosinophils Relative: 0 %
HCT: 39.2 % (ref 36.0–46.0)
Hemoglobin: 13.3 g/dL (ref 12.0–15.0)
Immature Granulocytes: 1 %
Lymphocytes Relative: 7 %
Lymphs Abs: 1.1 10*3/uL (ref 0.7–4.0)
MCH: 33.5 pg (ref 26.0–34.0)
MCHC: 33.9 g/dL (ref 30.0–36.0)
MCV: 98.7 fL (ref 80.0–100.0)
Monocytes Absolute: 0.3 10*3/uL (ref 0.1–1.0)
Monocytes Relative: 2 %
Neutro Abs: 14.9 10*3/uL — ABNORMAL HIGH (ref 1.7–7.7)
Neutrophils Relative %: 89 %
Platelets: 567 10*3/uL — ABNORMAL HIGH (ref 150–400)
RBC: 3.97 MIL/uL (ref 3.87–5.11)
RDW: 13.8 % (ref 11.5–15.5)
WBC: 16.5 10*3/uL — ABNORMAL HIGH (ref 4.0–10.5)
nRBC: 0 % (ref 0.0–0.2)

## 2019-04-19 LAB — COMPREHENSIVE METABOLIC PANEL
ALT: 21 U/L (ref 0–44)
AST: 17 U/L (ref 15–41)
Albumin: 4.1 g/dL (ref 3.5–5.0)
Alkaline Phosphatase: 65 U/L (ref 38–126)
Anion gap: 9 (ref 5–15)
BUN: 15 mg/dL (ref 6–20)
CO2: 27 mmol/L (ref 22–32)
Calcium: 9 mg/dL (ref 8.9–10.3)
Chloride: 104 mmol/L (ref 98–111)
Creatinine, Ser: 0.58 mg/dL (ref 0.44–1.00)
GFR calc Af Amer: >60 mL/min (ref >60)
GFR calc non Af Amer: >60 mL/min (ref >60)
Glucose, Bld: 136 mg/dL — ABNORMAL HIGH (ref 70–99)
Potassium: 3.7 mmol/L (ref 3.5–5.1)
Sodium: 140 mmol/L (ref 135–145)
Total Bilirubin: 0.6 mg/dL (ref 0.3–1.2)
Total Protein: 6.9 g/dL (ref 6.5–8.1)

## 2019-04-19 LAB — URINALYSIS, ROUTINE W REFLEX MICROSCOPIC
Bilirubin Urine: NEGATIVE
Glucose, UA: NEGATIVE mg/dL
Hgb urine dipstick: NEGATIVE
Ketones, ur: 5 mg/dL — AB
Leukocytes,Ua: NEGATIVE
Nitrite: NEGATIVE
Protein, ur: NEGATIVE mg/dL
Specific Gravity, Urine: 1.036 — ABNORMAL HIGH (ref 1.005–1.030)
pH: 6 (ref 5.0–8.0)

## 2019-04-19 LAB — SARS CORONAVIRUS 2 BY RT PCR (HOSPITAL ORDER, PERFORMED IN ~~LOC~~ HOSPITAL LAB): SARS Coronavirus 2: NEGATIVE

## 2019-04-19 LAB — LIPASE, BLOOD: Lipase: 32 U/L (ref 11–51)

## 2019-04-19 SURGERY — APPENDECTOMY, LAPAROSCOPIC
Anesthesia: General | Site: Abdomen

## 2019-04-19 MED ORDER — ROCURONIUM BROMIDE 10 MG/ML (PF) SYRINGE
PREFILLED_SYRINGE | INTRAVENOUS | Status: AC
  Filled 2019-04-19: qty 10

## 2019-04-19 MED ORDER — CHLORHEXIDINE GLUCONATE CLOTH 2 % EX PADS: 6.0000 | MEDICATED_PAD | Freq: Once | CUTANEOUS | Status: DC

## 2019-04-19 MED ORDER — BUPIVACAINE LIPOSOME 1.3 % IJ SUSP
INTRAMUSCULAR | Status: AC
  Filled 2019-04-19: qty 20

## 2019-04-19 MED ORDER — ROCURONIUM BROMIDE 50 MG/5ML IV SOSY
PREFILLED_SYRINGE | INTRAVENOUS | Status: DC | PRN
  Administered 2019-04-19: 20 mg via INTRAVENOUS

## 2019-04-19 MED ORDER — 0.9 % SODIUM CHLORIDE (POUR BTL) OPTIME
TOPICAL | Status: DC | PRN
  Administered 2019-04-19: 1000 mL

## 2019-04-19 MED ORDER — LIDOCAINE 2% (20 MG/ML) 5 ML SYRINGE
INTRAMUSCULAR | Status: DC | PRN
  Administered 2019-04-19: 40 mg via INTRAVENOUS

## 2019-04-19 MED ORDER — DEXAMETHASONE SODIUM PHOSPHATE 4 MG/ML IJ SOLN
INTRAMUSCULAR | Status: DC | PRN
  Administered 2019-04-19: 8 mg via INTRAVENOUS

## 2019-04-19 MED ORDER — SUCCINYLCHOLINE CHLORIDE 200 MG/10ML IV SOSY
PREFILLED_SYRINGE | INTRAVENOUS | Status: AC
  Filled 2019-04-19: qty 10

## 2019-04-19 MED ORDER — HYDROMORPHONE HCL 1 MG/ML IJ SOLN
INTRAMUSCULAR | Status: AC
  Filled 2019-04-19: qty 0.5

## 2019-04-19 MED ORDER — FENTANYL CITRATE (PF) 250 MCG/5ML IJ SOLN
INTRAMUSCULAR | Status: AC
  Filled 2019-04-19: qty 5

## 2019-04-19 MED ORDER — BUPIVACAINE LIPOSOME 1.3 % IJ SUSP
INTRAMUSCULAR | Status: DC | PRN
  Administered 2019-04-19: 20 mL

## 2019-04-19 MED ORDER — ONDANSETRON HCL 4 MG/2ML IJ SOLN
4.0000 mg | Freq: Once | INTRAMUSCULAR | Status: AC
  Administered 2019-04-19: 4 mg via INTRAVENOUS
  Filled 2019-04-19: qty 2

## 2019-04-19 MED ORDER — LACTATED RINGERS IV SOLN
INTRAVENOUS | Status: DC
  Administered 2019-04-19: 09:00:00 via INTRAVENOUS

## 2019-04-19 MED ORDER — PROPOFOL 10 MG/ML IV BOLUS
INTRAVENOUS | Status: AC
  Filled 2019-04-19: qty 40

## 2019-04-19 MED ORDER — MIDAZOLAM HCL 2 MG/2ML IJ SOLN: 0.5000 mg | Freq: Once | INTRAMUSCULAR | Status: DC | PRN

## 2019-04-19 MED ORDER — KETOROLAC TROMETHAMINE 30 MG/ML IJ SOLN
30.0000 mg | Freq: Once | INTRAMUSCULAR | Status: AC
  Administered 2019-04-19: 30 mg via INTRAVENOUS
  Filled 2019-04-19: qty 1

## 2019-04-19 MED ORDER — IOHEXOL 300 MG/ML  SOLN
100.0000 mL | Freq: Once | INTRAMUSCULAR | Status: AC | PRN
  Administered 2019-04-19: 100 mL via INTRAVENOUS

## 2019-04-19 MED ORDER — SODIUM CHLORIDE 0.9 % IV BOLUS
1000.0000 mL | Freq: Once | INTRAVENOUS | Status: AC
  Administered 2019-04-19: 1000 mL via INTRAVENOUS

## 2019-04-19 MED ORDER — HYDROCODONE-ACETAMINOPHEN 7.5-325 MG PO TABS: 1.0000 | ORAL_TABLET | Freq: Once | ORAL | Status: DC | PRN

## 2019-04-19 MED ORDER — HYDROMORPHONE HCL 1 MG/ML IJ SOLN
1.0000 mg | Freq: Once | INTRAMUSCULAR | Status: AC
  Administered 2019-04-19: 1 mg via INTRAVENOUS
  Filled 2019-04-19: qty 1

## 2019-04-19 MED ORDER — PROPOFOL 10 MG/ML IV BOLUS
INTRAVENOUS | Status: DC | PRN
  Administered 2019-04-19: 160 mg via INTRAVENOUS

## 2019-04-19 MED ORDER — GLYCOPYRROLATE PF 0.2 MG/ML IJ SOSY
PREFILLED_SYRINGE | INTRAMUSCULAR | Status: DC | PRN
  Administered 2019-04-19: .2 mg via INTRAVENOUS

## 2019-04-19 MED ORDER — SUGAMMADEX SODIUM 200 MG/2ML IV SOLN
INTRAVENOUS | Status: DC | PRN
  Administered 2019-04-19: 118 mg via INTRAVENOUS

## 2019-04-19 MED ORDER — SODIUM CHLORIDE 0.9 % IV SOLN
2.0000 g | Freq: Once | INTRAVENOUS | Status: AC
  Administered 2019-04-19: 2 g via INTRAVENOUS
  Filled 2019-04-19: qty 20

## 2019-04-19 MED ORDER — ONDANSETRON HCL 4 MG PO TABS: 4.0000 mg | ORAL_TABLET | Freq: Three times a day (TID) | ORAL | 1 refills | Status: DC | PRN

## 2019-04-19 MED ORDER — SUCCINYLCHOLINE CHLORIDE 20 MG/ML IJ SOLN
INTRAMUSCULAR | Status: DC | PRN
  Administered 2019-04-19: 100 mg via INTRAVENOUS

## 2019-04-19 MED ORDER — ONDANSETRON HCL 4 MG/2ML IJ SOLN
4.0000 mg | Freq: Once | INTRAMUSCULAR | Status: AC
  Administered 2019-04-19: 03:00:00 4 mg via INTRAVENOUS
  Filled 2019-04-19: qty 2

## 2019-04-19 MED ORDER — PROMETHAZINE HCL 25 MG/ML IJ SOLN: 6.2500 mg | INTRAMUSCULAR | Status: DC | PRN

## 2019-04-19 MED ORDER — HYDROCODONE-ACETAMINOPHEN 5-325 MG PO TABS: 1.0000 | ORAL_TABLET | ORAL | 0 refills | Status: DC | PRN

## 2019-04-19 MED ORDER — HYDROMORPHONE HCL 1 MG/ML IJ SOLN
0.2500 mg | INTRAMUSCULAR | Status: DC | PRN
  Administered 2019-04-19: 0.5 mg via INTRAVENOUS

## 2019-04-19 MED ORDER — FENTANYL CITRATE (PF) 100 MCG/2ML IJ SOLN
INTRAMUSCULAR | Status: DC | PRN
  Administered 2019-04-19: 100 ug via INTRAVENOUS
  Administered 2019-04-19 (×2): 50 ug via INTRAVENOUS

## 2019-04-19 MED ORDER — METRONIDAZOLE IN NACL 5-0.79 MG/ML-% IV SOLN
500.0000 mg | Freq: Once | INTRAVENOUS | Status: AC
  Administered 2019-04-19: 500 mg via INTRAVENOUS
  Filled 2019-04-19: qty 100

## 2019-04-19 MED ORDER — ONDANSETRON HCL 4 MG/2ML IJ SOLN
INTRAMUSCULAR | Status: DC | PRN
  Administered 2019-04-19: 4 mg via INTRAVENOUS

## 2019-04-19 SURGICAL SUPPLY — 47 items
BAG RETRIEVAL 10 (BASKET) ×1
BAG RETRIEVAL 10MM (BASKET) ×1
CHLORAPREP W/TINT 26 (MISCELLANEOUS) ×3 IMPLANT
CLOTH BEACON ORANGE TIMEOUT ST (SAFETY) ×3 IMPLANT
COVER LIGHT HANDLE STERIS (MISCELLANEOUS) ×6 IMPLANT
CUTTER FLEX LINEAR 45M (STAPLE) ×3 IMPLANT
DECANTER SPIKE VIAL GLASS SM (MISCELLANEOUS) ×3 IMPLANT
DERMABOND ADVANCED (GAUZE/BANDAGES/DRESSINGS) ×2
DERMABOND ADVANCED .7 DNX12 (GAUZE/BANDAGES/DRESSINGS) ×1 IMPLANT
ELECT REM PT RETURN 9FT ADLT (ELECTROSURGICAL) ×3
ELECTRODE REM PT RTRN 9FT ADLT (ELECTROSURGICAL) ×1 IMPLANT
EVACUATOR SMOKE 8.L (FILTER) ×3 IMPLANT
GLOVE BIOGEL PI IND STRL 7.0 (GLOVE) ×2 IMPLANT
GLOVE BIOGEL PI INDICATOR 7.0 (GLOVE) ×4
GLOVE SURG SS PI 7.5 STRL IVOR (GLOVE) ×3 IMPLANT
GOWN STRL REUS W/ TWL XL LVL3 (GOWN DISPOSABLE) ×1 IMPLANT
GOWN STRL REUS W/TWL LRG LVL3 (GOWN DISPOSABLE) ×3 IMPLANT
GOWN STRL REUS W/TWL XL LVL3 (GOWN DISPOSABLE) ×2
INST SET LAPROSCOPIC AP (KITS) ×3 IMPLANT
KIT TURNOVER KIT A (KITS) ×3 IMPLANT
MANIFOLD NEPTUNE II (INSTRUMENTS) ×3 IMPLANT
NEEDLE HYPO 18GX1.5 BLUNT FILL (NEEDLE) ×3 IMPLANT
NEEDLE HYPO 22GX1.5 SAFETY (NEEDLE) ×3 IMPLANT
NEEDLE INSUFFLATION 14GA 120MM (NEEDLE) ×3 IMPLANT
NS IRRIG 1000ML POUR BTL (IV SOLUTION) ×3 IMPLANT
PACK LAP CHOLE LZT030E (CUSTOM PROCEDURE TRAY) ×3 IMPLANT
PAD ARMBOARD 7.5X6 YLW CONV (MISCELLANEOUS) ×3 IMPLANT
PENCIL HANDSWITCHING (ELECTRODE) ×3 IMPLANT
RELOAD 45 VASCULAR/THIN (ENDOMECHANICALS) ×3 IMPLANT
SET BASIN LINEN APH (SET/KITS/TRAYS/PACK) ×3 IMPLANT
SHEARS HARMONIC ACE PLUS 36CM (ENDOMECHANICALS) ×3 IMPLANT
SPONGE GAUZE 2X2 8PLY STER LF (GAUZE/BANDAGES/DRESSINGS) ×3
SPONGE GAUZE 2X2 8PLY STRL LF (GAUZE/BANDAGES/DRESSINGS) ×6 IMPLANT
STAPLER VISISTAT (STAPLE) IMPLANT
SUT MNCRL AB 4-0 PS2 18 (SUTURE) ×3 IMPLANT
SUT VICRYL 0 UR6 27IN ABS (SUTURE) ×3 IMPLANT
SYR 20CC LL (SYRINGE) ×6 IMPLANT
SYS BAG RETRIEVAL 10MM (BASKET) ×1
SYSTEM BAG RETRIEVAL 10MM (BASKET) ×1 IMPLANT
TRAY FOLEY W/BAG SLVR 16FR (SET/KITS/TRAYS/PACK) ×2
TRAY FOLEY W/BAG SLVR 16FR ST (SET/KITS/TRAYS/PACK) ×1 IMPLANT
TROCAR ENDO BLADELESS 11MM (ENDOMECHANICALS) ×3 IMPLANT
TROCAR ENDO BLADELESS 12MM (ENDOMECHANICALS) ×3 IMPLANT
TROCAR XCEL NON-BLD 5MMX100MML (ENDOMECHANICALS) ×3 IMPLANT
TUBING INSUFFLATION (TUBING) ×3 IMPLANT
WARMER LAPAROSCOPE (MISCELLANEOUS) ×3 IMPLANT
YANKAUER SUCT 12FT TUBE ARGYLE (SUCTIONS) ×3 IMPLANT

## 2019-04-19 NOTE — Discharge Instructions (Signed)
Laparoscopic Appendectomy, Adult, Care After  This sheet gives you information about how to care for yourself after your procedure. Your health care provider may also give you more specific instructions. If you have problems or questions, contact your health care provider.  What can I expect after the procedure?  After the procedure, it is common to have:  · Little energy for normal activities.  · Mild pain in the area where the incisions were made.  · Difficulty passing stool (constipation). This can be caused by:  ? Pain medicine.  ? A decrease in your activity.  Follow these instructions at home:  Medicines  · Take over-the-counter and prescription medicines only as told by your health care provider.  · If you were prescribed an antibiotic medicine, take it as told by your health care provider. Do not stop taking the antibiotic even if you start to feel better.  · Do not drive or use heavy machinery while taking prescription pain medicine.  · Ask your health care provider if the medicine prescribed to you can cause constipation. You may need to take steps to prevent or treat constipation, such as:  ? Drink enough fluid to keep your urine pale yellow.  ? Take over-the-counter or prescription medicines.  ? Eat foods that are high in fiber, such as beans, whole grains, and fresh fruits and vegetables.  ? Limit foods that are high in fat and processed sugars, such as fried or sweet foods.  Incision care    · Follow instructions from your health care provider about how to take care of your incisions. Make sure you:  ? Wash your hands with soap and water before and after you change your bandage (dressing). If soap and water are not available, use hand sanitizer.  ? Change your dressing as told by your health care provider.  ? Leave stitches (sutures), skin glue, or adhesive strips in place. These skin closures may need to stay in place for 2 weeks or longer. If adhesive strip edges start to loosen and curl up, you may  trim the loose edges. Do not remove adhesive strips completely unless your health care provider tells you to do that.  · Check your incision areas every day for signs of infection. Check for:  ? Redness, swelling, or pain.  ? Fluid or blood.  ? Warmth.  ? Pus or a bad smell.  Bathing  · Keep your incisions clean and dry. Clean them as often as told by your health care provider. To do this:  1. Gently wash the incisions with soap and water.  2. Rinse the incisions with water to remove all soap.  3. Pat the incisions dry with a clean towel. Do not rub the incisions.  · Do not take baths, swim, or use a hot tub for 2 weeks, or until your health care provider approves. You may take showers after 48 hours.  Activity    · Do not drive for 24 hours if you were given a sedative during your procedure.  · Rest after the procedure. Return to your normal activities as told by your health care provider. Ask your health care provider what activities are safe for you.  · For 3 weeks, or for as long as told by your health care provider:  ? Do not lift anything that is heavier than 10 lb (4.5 kg), or the limit that you are told.  ? Do not play contact sports.  General instructions  · If you   care provider. This is important. Contact a health care provider if:  You have redness, swelling, or pain around an incision.  You have fluid or blood coming from an incision.  Your incision feels warm to the touch.  You have pus or a bad smell coming from an incision or dressing.  Your incision edges break open after your sutures have been removed.  You have increasing pain in your shoulders.  You feel dizzy or you faint.  You develop shortness of breath.  You keep feeling  nauseous or you are vomiting.  You have diarrhea or you cannot control your bowel functions.  You lose your appetite.  You develop swelling or pain in your legs.  You develop a rash. Get help right away if you have:  A fever.  Difficulty breathing.  Sharp pains in your chest. Summary  After a laparoscopic appendectomy, it is common to have little energy for normal activities, mild pain in the area of the incisions, and constipation.  Infection is the most common complication after this procedure. Follow your health care provider's instructions about caring for yourself after the procedure.  Rest after the procedure. Return to your normal activities as told by your health care provider.  Contact your health care provider if you notice signs of infection around your incisions or you develop shortness of breath. Get help right away if you have a fever, chest pain, or difficulty breathing. This information is not intended to replace advice given to you by your health care provider. Make sure you discuss any questions you have with your health care provider. Document Released: 11/07/2005 Document Revised: 05/10/2018 Document Reviewed: 05/10/2018 Elsevier Interactive Patient Education  2019 [employer] Anesthesia, Adult, Care After This sheet gives you information about how to care for yourself after your procedure. Your health care provider may also give you more specific instructions. If you have problems or questions, contact your health care provider. What can I expect after the procedure? After the procedure, the following side effects are common:  Pain or discomfort at the IV site.  Nausea.  Vomiting.  Sore throat.  Trouble concentrating.  Feeling cold or chills.  Weak or tired.  Sleepiness and fatigue.  Soreness and body aches. These side effects can affect parts of the body that were not involved in surgery. Follow these instructions at home:  For  at least 24 hours after the procedure:  Have a responsible adult stay with you. It is important to have someone help care for you until you are awake and alert.  Rest as needed.  Do not: ? Participate in activities in which you could fall or become injured. ? Drive. ? Use heavy machinery. ? Drink alcohol. ? Take sleeping pills or medicines that cause drowsiness. ? Make important decisions or sign legal documents. ? Take care of children on your own. Eating and drinking  Follow any instructions from your health care provider about eating or drinking restrictions.  When you feel hungry, start by eating small amounts of foods that are soft and easy to digest (bland), such as toast. Gradually return to your regular diet.  Drink enough fluid to keep your urine pale yellow.  If you vomit, rehydrate by drinking water, juice, or clear broth. General instructions  If you have sleep apnea, surgery and certain medicines can increase your risk for breathing problems. Follow instructions from your health care provider about wearing your sleep device: ? Anytime you are sleeping, including during  daytime naps. ? While taking prescription pain medicines, sleeping medicines, or medicines that make you drowsy.  Return to your normal activities as told by your health care provider. Ask your health care provider what activities are safe for you.  Take over-the-counter and prescription medicines only as told by your health care provider.  If you smoke, do not smoke without supervision.  Keep all follow-up visits as told by your health care provider. This is important. Contact a health care provider if:  You have nausea or vomiting that does not get better with medicine.  You cannot eat or drink without vomiting.  You have pain that does not get better with medicine.  You are unable to pass urine.  You develop a skin rash.  You have a fever.  You have redness around your IV site that gets  worse. Get help right away if:  You have difficulty breathing.  You have chest pain.  You have blood in your urine or stool, or you vomit blood. Summary  After the procedure, it is common to have a sore throat or nausea. It is also common to feel tired.  Have a responsible adult stay with you for the first 24 hours after general anesthesia. It is important to have someone help care for you until you are awake and alert.  When you feel hungry, start by eating small amounts of foods that are soft and easy to digest (bland), such as toast. Gradually return to your regular diet.  Drink enough fluid to keep your urine pale yellow.  Return to your normal activities as told by your health care provider. Ask your health care provider what activities are safe for you. This information is not intended to replace advice given to you by your health care provider. Make sure you discuss any questions you have with your health care provider. Document Released: 02/13/2001 Document Revised: 06/23/2017 Document Reviewed: 06/23/2017 Elsevier Interactive Patient Education  2019 [employer].

## 2019-04-19 NOTE — ED Notes (Signed)
Patient transported to CT 

## 2019-04-19 NOTE — ED Triage Notes (Signed)
Pt from home via POV c/o abdominal pain in RUQ/LUQ with N/V without diarrhea which began apprx 1900 yesterday evening. Symptoms unrelieved with antacids at home or sips of ginger ale and Pt reports some relief from pain after episodes of emesis, but pain quickly returns.

## 2019-04-19 NOTE — Anesthesia Procedure Notes (Signed)
Procedure Name: Intubation Date/Time: 04/19/2019 9:32 AM Performed by: Andree Elk, Amy A, CRNA Pre-anesthesia Checklist: Patient identified, Patient being monitored, Timeout performed, Emergency Drugs available and Suction available Patient Re-evaluated:Patient Re-evaluated prior to induction Oxygen Delivery Method: Circle system utilized Preoxygenation: Pre-oxygenation with 100% oxygen Induction Type: IV induction Laryngoscope Size: 3 and Glidescope Grade View: Grade I Tube type: Oral Tube size: 7.0 mm Number of attempts: 1 Airway Equipment and Method: Stylet Placement Confirmation: ETT inserted through vocal cords under direct vision,  positive ETCO2 and breath sounds checked- equal and bilateral Secured at: 21 cm Tube secured with: Tape Dental Injury: Teeth and Oropharynx as per pre-operative assessment

## 2019-04-19 NOTE — Op Note (Signed)
Patient:  Regina [name]  DOB:  1966/02/23  MRN:  [id number]   Preop Diagnosis: Acute appendicitis  Postop Diagnosis: Same  Procedure: Laparoscopic appendectomy  Surgeon: Aviva Signs, MD  Anes: General endotracheal  Indications: Patient is a 53 year old white female who presented to the emergency room with worsening right-sided abdominal pain.  CT scan of the abdomen reveals acute appendicitis.  The risks and benefits of the procedure including bleeding, infection, and the possibility of an open procedure were fully explained to the patient, who gave informed consent.  Procedure note: The patient was placed in supine position.  After induction of general endotracheal anesthesia, the abdomen was prepped and draped using the usual sterile technique with ChloraPrep.  Surgical site confirmation was performed.  A supraumbilical incision was made down to the fascia.  A Veress needle was introduced into the abdominal cavity and confirmation of placement was done using the saline drop test.  The abdomen was then insufflated to 15 mmHg pressure.  An 11 mm trocar was introduced into the abdominal cavity under direct visualization without difficulty.  The patient was placed in a deeper Trendelenburg position and an additional 12 mm trocar was placed in the suprapubic region and a 5 mm trocar was placed in the left lower quadrant region.  The appendix was visualized and noted to be acutely inflamed except at its base.  The mesoappendix was divided using the harmonic scalpel.  The juncture of the appendix to the cecum was fully visualized.  A vascular Endo GIA was placed across the base the appendix and fired.  The appendix was then removed using an Endo Catch bag without difficulty.  The staple line was inspected and noted to be within normal limits.  All fluid and air were then evacuated from the abdominal cavity prior to removal of the trochars.  All wounds were irrigated with normal saline.  All wounds  were injected with Exparel.  The supraumbilical fascia as well as suprapubic fascia were reapproximated using 0 Vicryl interrupted sutures.  All skin incisions were closed using a 4-0 Monocryl subcuticular suture.  Dermabond was applied.  All tape and needle counts were correct at the end of the procedure.  The patient was extubated in the operating room and transferred to PACU in stable condition.  Complications: None  EBL: Minimal  Specimen: Appendix

## 2019-04-19 NOTE — ED Provider Notes (Signed)
[facility] EMERGENCY DEPARTMENT Provider Note   CSN: [id number] Arrival date & time: 04/19/19  0210    History   Chief Complaint Chief Complaint  Patient presents with   Abdominal Pain    HPI Regina [name] is a 53 y.o. female.     Patient presents to the emergency department for evaluation of abdominal pain.  Patient reports that symptoms began approximately 7 or 8 hours ago.  It started as a pain in the epigastric area that progressively worsened.  She developed nausea and vomiting, no diarrhea.  No fever, no chest pain or shortness of breath.  Patient reports that seems symptoms began she has now noticed that the pain is migrating more to the right side of the abdomen.  Pain is briefly relieved after vomiting but then returns.  She has never had similar symptoms.     Past Medical History:  Diagnosis Date   Endometriosis    Mitral valve prolapse    Stroke ([place]) 03/22/2014    Patient Active Problem List   Diagnosis Date Noted   SAH (subarachnoid hemorrhage) ([place])    Ovarian mass 04/25/2014   CVA (cerebral infarction) 04/01/2014   Hemiparesis affecting left side as late effect of stroke ([place]) 04/01/2014   Lack of coordination 04/01/2014   Muscle weakness (generalized) 04/01/2014   Stroke ([place]) 03/24/2014    Past Surgical History:  Procedure Laterality Date   ROOT CANAL     TEE WITHOUT CARDIOVERSION N/A 03/26/2014   Procedure: TRANSESOPHAGEAL ECHOCARDIOGRAM (TEE);  Surgeon: Larey Dresser, MD;  Location: [facility] ENDOSCOPY;  Service: Cardiovascular;  Laterality: N/A;   WISDOM TOOTH EXTRACTION       OB History    Gravida  5   Para  4   Term  4   Preterm      AB  1   Living  4     SAB  1   TAB      Ectopic      Multiple      Live Births  4            Home Medications    Prior to Admission medications   Medication Sig Start Date End Date Taking? Authorizing Provider  acetaminophen (TYLENOL) 325 MG tablet Take 650 mg by mouth as  needed for headache.     [provider]  cetirizine (ZYRTEC) 10 MG tablet Take 10 mg by mouth daily as needed for allergies.    [provider]  diphenhydrAMINE (BENADRYL) 25 MG tablet Take 25 mg by mouth every 6 (six) hours as needed for allergies or sleep.     [provider]  ibuprofen (ADVIL,MOTRIN) 200 MG tablet Take 200 mg by mouth as needed for headache.     [provider]  LORazepam (ATIVAN) 0.5 MG tablet Take 0.5 mg by mouth every 6 (six) hours as needed for anxiety.    [provider]  Magnesium 250 MG TABS Take by mouth daily.    [provider]  NON FORMULARY     [provider]  tiZANidine (ZANAFLEX) 2 MG tablet Take 1 tablet (2 mg total) by mouth every 8 (eight) hours as needed. Patient taking differently: Take 2 mg by mouth every 8 (eight) hours as needed for muscle spasms.  10/07/14   Philmore Pali, NP    Family History Family History  Problem Relation Age of Onset   Hyperlipidemia Mother    Hypertension Father    Cancer Father  kidney   Asthma Daughter        exercise induced   Heart murmur Son    Migraines Son    Diabetes Maternal Grandmother    Heart disease Maternal Grandmother    Heart attack Maternal Grandfather    Heart disease Maternal Grandfather     Social History Social History   Tobacco Use   Smoking status: Never Smoker   Smokeless tobacco: Never Used  Substance Use Topics   Alcohol use: Yes    Comment: rarely per pt   Drug use: No     Allergies   Patient has no known allergies.   Review of Systems Review of Systems  Constitutional: Negative for fever.  Respiratory: Negative for shortness of breath.   Cardiovascular: Negative for chest pain.  Gastrointestinal: Positive for abdominal pain, nausea and vomiting. Negative for diarrhea.  All other systems reviewed and are negative.    Physical Exam Updated Vital Signs BP (!) 122/57 (BP Location: Left Arm)     Pulse 65    Temp 97.9 F (36.6 C) (Oral)    Resp 16    Ht 5\' 4"  (1.626 m)    Wt 59 kg    LMP 02/15/2015    SpO2 99%    BMI 22.31 kg/m   Physical Exam Vitals signs and nursing note reviewed.  Constitutional:      General: She is not in acute distress.    Appearance: Normal appearance. She is well-developed.  HENT:     Head: Normocephalic and atraumatic.     Right Ear: Hearing normal.     Left Ear: Hearing normal.     Nose: Nose normal.  Eyes:     Conjunctiva/sclera: Conjunctivae normal.     Pupils: Pupils are equal, round, and reactive to light.  Neck:     Musculoskeletal: Normal range of motion and neck supple.  Cardiovascular:     Rate and Rhythm: Regular rhythm.     Heart sounds: S1 normal and S2 normal. No murmur. No friction rub. No gallop.   Pulmonary:     Effort: Pulmonary effort is normal. No respiratory distress.     Breath sounds: Normal breath sounds.  Chest:     Chest wall: No tenderness.  Abdominal:     General: Bowel sounds are normal.     Palpations: Abdomen is soft.     Tenderness: There is abdominal tenderness in the right upper quadrant and right lower quadrant. There is no guarding or rebound. Negative signs include Murphy's sign and McBurney's sign.     Hernia: No hernia is present.  Musculoskeletal: Normal range of motion.  Skin:    General: Skin is warm and dry.     Findings: No rash.  Neurological:     Mental Status: She is alert and oriented to person, place, and time.     GCS: GCS eye subscore is 4. GCS verbal subscore is 5. GCS motor subscore is 6.     Cranial Nerves: No cranial nerve deficit.     Sensory: No sensory deficit.     Coordination: Coordination normal.  Psychiatric:        Speech: Speech normal.        Behavior: Behavior normal.        Thought Content: Thought content normal.      ED Treatments / Results  Labs (all labs ordered are listed, but only abnormal results are displayed) Labs Reviewed  CBC WITH  DIFFERENTIAL/PLATELET - Abnormal; Notable for the  following components:      Result Value   WBC 16.5 (*)    Platelets 567 (*)    Neutro Abs 14.9 (*)    Abs Immature Granulocytes 0.08 (*)    All other components within normal limits  COMPREHENSIVE METABOLIC PANEL - Abnormal; Notable for the following components:   Glucose, Bld 136 (*)    All other components within normal limits  LIPASE, BLOOD  URINALYSIS, ROUTINE W REFLEX MICROSCOPIC    EKG EKG Interpretation  Date/Time:  Friday Apr 19 2019 02:49:40 EDT Ventricular Rate:  61 PR Interval:    QRS Duration: 92 QT Interval:  417 QTC Calculation: 420 R Axis:   90 Text Interpretation:  Sinus rhythm Borderline right axis deviation Confirmed by Orpah Greek [phone]) on 04/19/2019 3:29:42 AM   Radiology Ct Abdomen Pelvis W Contrast  Result Date: 04/19/2019 CLINICAL DATA:  Abdominal pain EXAM: CT ABDOMEN AND PELVIS WITH CONTRAST TECHNIQUE: Multidetector CT imaging of the abdomen and pelvis was performed using the standard protocol following bolus administration of intravenous contrast. 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 Mild cardiomegaly. Hepatobiliary: Slight periportal edema. No focal hepatic abnormality. No calcified gallstone. Pancreas: Unremarkable. No pancreatic ductal dilatation or surrounding inflammatory changes. Spleen: Enlarged measuring up to 14 cm Adrenals/Urinary Tract: Adrenal glands are unremarkable. Kidneys are normal, without renal calculi, focal lesion, or hydronephrosis. Bladder is unremarkable. Stomach/Bowel: Stomach is within normal limits. No dilated small bowel. Dilated appendix, measuring up to 11 mm in diameter. Mild surrounding soft tissue inflammation. Small appendicoliths at  the origin on coronal views. No extraluminal gas collection. Tiny gas in the tip of the appendix. Negative for colon wall thickening. Vascular/Lymphatic: Nonaneurysmal aorta. No significantly enlarged lymph nodes Reproductive: Uterus unremarkable. 6 cm left adnexal mass, likely corresponding to previously noted complex cysts on sonography, possible endometrioma dermoid. Other: Negative for free air or free fluid Musculoskeletal: No acute or significant osseous findings. IMPRESSION: 1. Findings consistent with acute appendicitis. Appendix: Location: Right lower quadrant. Retrocecal with ascending configuration, tip of the appendix at the inferior hepatic margin. Diameter: 11 mm Appendicolith: Suspected appendicoliths at the origin of the appendix Mucosal hyper-enhancement: Mild mucosal hyperenhancement Extraluminal gas: Negative Periappendiceal collection: Negative 2. Essentially stable 6 cm left adnexal mass, felt consistent with complex cyst, possible dermoid or endometrioma. This has not significantly increased in size. Electronically Signed   By: Donavan Foil M.D.   On: 04/19/2019 03:49    Procedures Procedures (including critical care time)  Medications Ordered in ED Medications  cefTRIAXone (ROCEPHIN) 2 g in sodium chloride 0.9 % 100 mL IVPB (has no administration in time range)    And  metroNIDAZOLE (FLAGYL) IVPB 500 mg (has no administration in time range)  sodium chloride 0.9 % bolus 1,000 mL (1,000 mLs Intravenous New Bag/Given 04/19/19 0252)  ondansetron (ZOFRAN) injection 4 mg (4 mg Intravenous Given 04/19/19 0252)  HYDROmorphone (DILAUDID) injection 1 mg (1 mg Intravenous Given 04/19/19 0253)  iohexol (OMNIPAQUE) 300 MG/ML solution 100 mL (100 mLs Intravenous Contrast Given 04/19/19 0322)     Initial Impression / Assessment and Plan / ED Course  I have reviewed the triage vital signs and the nursing notes.  Pertinent labs & imaging results that were available during my care of the  patient were reviewed by  me and considered in my medical decision making (see chart for details).        Patient presents to the emergency department with complaints of abdominal pain.  She initially had mid upper abdominal pain that migrated to the right side of her abdomen.  Examination revealed diffuse right-sided tenderness without focality.  She has had associated nausea and vomiting, no diarrhea.  Patient is afebrile at arrival, vital signs are all normal.  She did have a moderate leukocytosis.  Patient underwent CT scan which shows evidence of retrocecal appendix with acute appendicitis and appendicolith.  Will require hospitalization for appendectomy.  Final Clinical Impressions(s) / ED Diagnoses   Final diagnoses:  Acute appendicitis, unspecified acute appendicitis type    ED Discharge Orders    None       Orpah Greek, MD 04/19/19 [phone]

## 2019-04-19 NOTE — H&P (Signed)
Regina [name] is an 53 y.o. female.   Chief Complaint: Right-sided abdominal pain HPI: Patient is a 53 year old white female who presented to the emergency room with a 12-hour history of worsening right-sided abdominal pain.  CT scan of the abdomen reveals a retrocecal acute appendicitis with appendicolith.  Patient has generalized malaise.  She denies any fever or chills.  Her pain is currently 3 out of 10.  Past Medical History:  Diagnosis Date  . Endometriosis   . Mitral valve prolapse   . Stroke [facility]) 03/22/2014    Past Surgical History:  Procedure Laterality Date  . ROOT CANAL    . TEE WITHOUT CARDIOVERSION N/A 03/26/2014   Procedure: TRANSESOPHAGEAL ECHOCARDIOGRAM (TEE);  Surgeon: Larey Dresser, MD;  Location: [facility] END[facility]COPY;  Service: Cardiovascular;  Laterality: N/A;  . WISDOM TOOTH EXTRACTION      Family History  Problem Relation Age of Onset  . Hyperlipidemia Mother   . Hypertension Father   . Cancer Father        kidney  . Asthma Daughter        exercise induced  . Heart murmur Son   . Migraines Son   . Diabetes Maternal Grandmother   . Heart disease Maternal Grandmother   . Heart attack Maternal Grandfather   . Heart disease Maternal Grandfather    Social History:  reports that she has never smoked. She has never used smokeless tobacco. She reports current alcohol use. She reports that she does not use drugs.  Allergies: No Known Allergies  (Not in a hospital admission)   Results for orders placed or performed during the hospital encounter of 04/19/19 (from the past 48 hour(s))  CBC with Differential/Platelet     Status: Abnormal   Collection Time: 04/19/19  2:41 AM  Result Value Ref Range   WBC 16.5 (H) 4.0 - 10.5 K/uL   RBC 3.97 3.87 - 5.11 MIL/uL   Hemoglobin 13.3 12.0 - 15.0 g/dL   HCT 39.2 36.0 - 46.0 %   MCV 98.7 80.0 - 100.0 fL   MCH 33.5 26.0 - 34.0 pg   MCHC 33.9 30.0 - 36.0 g/dL   RDW 13.8 11.5 - 15.5 %   Platelets 567 (H) 150 - 400 K/uL    nRBC 0.0 0.0 - 0.2 %   Neutrophils Relative % 89 %   Neutro Abs 14.9 (H) 1.7 - 7.7 K/uL   Lymphocytes Relative 7 %   Lymphs Abs 1.1 0.7 - 4.0 K/uL   Monocytes Relative 2 %   Monocytes Absolute 0.3 0.1 - 1.0 K/uL   Eosinophils Relative 0 %   Eosinophils Absolute 0.0 0.0 - 0.5 K/uL   Basophils Relative 1 %   Basophils Absolute 0.1 0.0 - 0.1 K/uL   Immature Granulocytes 1 %   Abs Immature Granulocytes 0.08 (H) 0.00 - 0.07 K/uL    Comment: Performed at [facility], [address], [place], [place] [address]  Comprehensive metabolic panel     Status: Abnormal   Collection Time: 04/19/19  2:41 AM  Result Value Ref Range   Sodium 140 135 - 145 mmol/L   Potassium 3.7 3.5 - 5.1 mmol/L   Chloride 104 98 - 111 mmol/L   CO2 27 22 - 32 mmol/L   Glucose, Bld 136 (H) 70 - 99 mg/dL   BUN 15 6 - 20 mg/dL   Creatinine, Ser 0.58 0.44 - 1.00 mg/dL   Calcium 9.0 8.9 - 10.3 mg/dL   Total Protein 6.9 6.5 -  8.1 g/dL   Albumin 4.1 3.5 - 5.0 g/dL   AST 17 15 - 41 U/L   ALT 21 0 - 44 U/L   Alkaline Phosphatase 65 38 - 126 U/L   Total Bilirubin 0.6 0.3 - 1.2 mg/dL   GFR calc non Af Amer >60 >60 mL/min   GFR calc Af Amer >60 >60 mL/min   Anion gap 9 5 - 15    Comment: Performed at [facility], [address] [place], [place] [address]  Lipase, blood     Status: None   Collection Time: 04/19/19  2:41 AM  Result Value Ref Range   Lipase 32 11 - 51 U/L    Comment: Performed at [facility], [address], [place], [place] [address]  Urinalysis, Routine w reflex microscopic     Status: Abnormal   Collection Time: 04/19/19  2:41 AM  Result Value Ref Range   Color, Urine STRAW (A) YELLOW   APPearance CLEAR CLEAR   Specific Gravity, Urine 1.036 (H) 1.005 - 1.030   pH 6.0 5.0 - 8.0   Glucose, UA NEGATIVE NEGATIVE mg/dL   Hgb urine dipstick NEGATIVE NEGATIVE   Bilirubin Urine NEGATIVE NEGATIVE   Ketones, ur 5 (A) NEGATIVE mg/dL   Protein, ur NEGATIVE NEGATIVE mg/dL   Nitrite NEGATIVE NEGATIVE    Leukocytes,Ua NEGATIVE NEGATIVE    Comment: Performed at [facility], [address] [place], [place] [address]  SARS Coronavirus 2 (CEPHEID - Performed in [place] hospital lab), Hosp Order     Status: None   Collection Time: 04/19/19  5:56 AM  Result Value Ref Range   SARS Coronavirus 2 NEGATIVE NEGATIVE    Comment: (NOTE) If result is NEGATIVE SARS-CoV-2 target nucleic acids are NOT DETECTED. The SARS-CoV-2 RNA is generally detectable in upper and lower  respiratory specimens during the acute phase of infection. The lowest  concentration of SARS-CoV-2 viral copies this assay can detect is 250  copies / mL. A negative result does not preclude SARS-CoV-2 infection  and should not be used as the sole basis for treatment or other  patient management decisions.  A negative result may occur with  improper specimen collection / handling, submission of specimen other  than nasopharyngeal swab, presence of viral mutation(s) within the  areas targeted by this assay, and inadequate number of viral copies  (<250 copies / mL). A negative result must be combined with clinical  observations, patient history, and epidemiological information. If result is POSITIVE SARS-CoV-2 target nucleic acids are DETECTED. The SARS-CoV-2 RNA is generally detectable in upper and lower  respiratory specimens dur ing the acute phase of infection.  Positive  results are indicative of active infection with SARS-CoV-2.  Clinical  correlation with patient history and other diagnostic information is  necessary to determine patient infection status.  Positive results do  not rule out bacterial infection or co-infection with other viruses. If result is PRESUMPTIVE POSTIVE SARS-CoV-2 nucleic acids MAY BE PRESENT.   A presumptive positive result was obtained on the submitted specimen  and confirmed on repeat testing.  While 2019 novel coronavirus  (SARS-CoV-2) nucleic acids may be present in the submitted sample   additional confirmatory testing may be necessary for epidemiological  and / or clinical management purposes  to differentiate between  SARS-CoV-2 and other Sarbecovirus currently known to infect humans.  If clinically indicated additional testing with an alternate test  methodology [phone]) is advised. The SARS-CoV-2 RNA is generally  detectable in upper and  lower respiratory sp ecimens during the acute  phase of infection. The expected result is Negative. Fact Sheet for Patients:  StrictlyIdeas.no Fact Sheet for Healthcare Providers: BankingDealers.co.za This test is not yet approved or cleared by the [place] FDA and has been authorized for detection and/or diagnosis of SARS-CoV-2 by FDA under an Emergency Use Authorization (EUA).  This EUA will remain in effect (meaning this test can be used) for the duration of the COVID-19 declaration under Section 564(b)(1) of the Act, 21 U.S.C. section 360bbb-3(b)(1), unless the authorization is terminated or revoked sooner. Performed at [facility], [address] [place], [place] [address]    Ct Abdomen Pelvis W Contrast  Result Date: 04/19/2019 CLINICAL DATA:  Abdominal pain EXAM: CT ABDOMEN AND PELVIS WITH CONTRAST TECHNIQUE: Multidetector CT imaging of the abdomen and pelvis was performed using the standard protocol following bolus administration of intravenous contrast. 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 Mild cardiomegaly. Hepatobiliary: Slight periportal edema. No focal hepatic abnormality. No calcified gallstone. Pancreas: Unremarkable. No pancreatic ductal dilatation or surrounding inflammatory changes. Spleen: Enlarged measuring up to 14 cm  Adrenals/Urinary Tract: Adrenal glands are unremarkable. Kidneys are normal, without renal calculi, focal lesion, or hydronephrosis. Bladder is unremarkable. Stomach/Bowel: Stomach is within normal limits. No dilated small bowel. Dilated appendix, measuring up to 11 mm in diameter. Mild surrounding soft tissue inflammation. Small appendicoliths at the origin on coronal views. No extraluminal gas collection. Tiny gas in the tip of the appendix. Negative for colon wall thickening. Vascular/Lymphatic: Nonaneurysmal aorta. No significantly enlarged lymph nodes Reproductive: Uterus unremarkable. 6 cm left adnexal mass, likely corresponding to previously noted complex cysts on sonography, possible endometrioma dermoid. Other: Negative for free air or free fluid Musculoskeletal: No acute or significant osseous findings. IMPRESSION: 1. Findings consistent with acute appendicitis. Appendix: Location: Right lower quadrant. Retrocecal with ascending configuration, tip of the appendix at the inferior hepatic margin. Diameter: 11 mm Appendicolith: Suspected appendicoliths at the origin of the appendix Mucosal hyper-enhancement: Mild mucosal hyperenhancement Extraluminal gas: Negative Periappendiceal collection: Negative 2. Essentially stable 6 cm left adnexal mass, felt consistent with complex cyst, possible dermoid or endometrioma. This has not significantly increased in size. Electronically Signed   By: Donavan Foil M.D.   On: 04/19/2019 03:49    Review of Systems  Constitutional: Positive for malaise/fatigue.  HENT: Negative.   Eyes: Negative.   Respiratory: Negative.   Cardiovascular: Negative.   Gastrointestinal: Negative.   Genitourinary: Negative.   Musculoskeletal: Negative.   Skin: Negative.   Neurological: Negative.   Endo/Heme/Allergies: Negative.   Psychiatric/Behavioral: Negative.     Blood pressure (!) 113/58, pulse 63, temperature 97.9 F (36.6 C), temperature source Oral, resp. rate 16, height  5\' 4"  (1.626 m), weight 59 kg, last menstrual period 02/15/2015, SpO2 99 %. Physical Exam  Vitals reviewed. Constitutional: She is oriented to person, place, and time. She appears well-developed and well-nourished. No distress.  HENT:  Head: Normocephalic and atraumatic.  Cardiovascular: Normal rate, regular rhythm and normal heart sounds. Exam reveals no gallop and no friction rub.  No murmur heard. Respiratory: Effort normal and breath sounds normal. No respiratory distress. She has no wheezes. She has no rales.  GI: Soft. Bowel sounds are normal. She exhibits no distension. There is abdominal tenderness. There is guarding.  There is no rebound.  Tender along the right flank to palpation.  No rigidity is noted.  Neurological: She is alert and oriented to person, place, and time.  Skin: Skin is warm and dry.    CT scan images personally reviewed Assessment/Plan Impression: Acute appendicitis Plan: Patient be taken to the operating room this morning for a laparoscopic appendectomy.  The risks and benefits of the procedure including bleeding, infection, and the possibility of an open procedure were fully explained to the patient, who gave informed consent.  Aviva Signs, MD 04/19/2019, 7:16 AM

## 2019-04-19 NOTE — Interval H&P Note (Signed)
History and Physical Interval Note:  04/19/2019 8:48 AM  Regina [name]  has presented today for surgery, with the diagnosis of acute appendicitis.  The various methods of treatment have been discussed with the patient and family. After consideration of risks, benefits and other options for treatment, the patient has consented to  Procedure(s): APPENDECTOMY LAPAROSCOPIC (N/A) as a surgical intervention.  The patient's history has been reviewed, patient examined, no change in status, stable for surgery.  I have reviewed the patient's chart and labs.  Questions were answered to the patient's satisfaction.     Aviva Signs

## 2019-04-19 NOTE — Anesthesia Preprocedure Evaluation (Signed)
Anesthesia Evaluation  Patient identified by MRN, date of birth, ID band Patient awake    Reviewed: Allergy & Precautions, NPO status , Patient's Chart, lab work & pertinent test results  Airway Mallampati: II  TM Distance: >3 FB Neck ROM: Full    Dental no notable dental hx. 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 Had L hemiparesis which resolved. Reports no issues since. States walks without any assists.  CVA, No Residual Symptoms negative psych ROS   GI/Hepatic negative GI ROS, Neg liver ROS,   Endo/Other  negative endocrine ROS  Renal/GU negative Renal ROS  negative genitourinary   Musculoskeletal negative musculoskeletal ROS (+)   Abdominal   Peds negative pediatric ROS (+)  Hematology negative hematology ROS (+)   Anesthesia Other Findings   Reproductive/Obstetrics negative OB ROS                             Anesthesia Physical Anesthesia Plan  ASA: II  Anesthesia Plan: General   Post-op Pain Management:    Induction: Intravenous  PONV Risk Score and Plan:   Airway Management Planned: Oral ETT  Additional Equipment:   Intra-op Plan:   Post-operative Plan: Extubation in OR  Informed Consent: I have reviewed the patients History and Physical, chart, labs and discussed the procedure including the risks, benefits and alternatives for the proposed anesthesia with the patient or authorized representative who has indicated his/her understanding and acceptance.     Dental advisory given  Plan Discussed  with: CRNA  Anesthesia Plan Comments: (Plan Full PPE use  Plan GETA -d/w pt -WTP with same after Q&A)        Anesthesia Quick Evaluation

## 2019-04-19 NOTE — Transfer of Care (Signed)
Immediate Anesthesia Transfer of Care Note  Patient: Regina [name]  Procedure(s) Performed: APPENDECTOMY LAPAROSCOPIC (N/A Abdomen)  Patient Location: PACU  Anesthesia Type:General  Level of Consciousness: awake, oriented and patient cooperative  Airway & Oxygen Therapy: Patient Spontanous Breathing and Patient connected to nasal cannula oxygen  Post-op Assessment: Report given to RN and Post -op Vital signs reviewed and stable  Post vital signs: Reviewed and stable  Last Vitals:  Vitals Value Taken Time  BP 112/61 04/19/2019 10:15 AM  Temp 37 C 04/19/2019 10:15 AM  Pulse 58 04/19/2019 10:19 AM  Resp 10 04/19/2019 10:19 AM  SpO2 100 % 04/19/2019 10:19 AM  Vitals shown include unvalidated device data.  Last Pain:  Vitals:   04/19/19 0808  TempSrc: Oral  PainSc: 6          Complications: No apparent anesthesia complications

## 2019-04-19 NOTE — Anesthesia Postprocedure Evaluation (Signed)
Anesthesia Post Note  Patient: Regina [name]  Procedure(s) Performed: APPENDECTOMY LAPAROSCOPIC (N/A Abdomen)  Patient location during evaluation: PACU Anesthesia Type: General Level of consciousness: awake and alert and oriented Pain management: pain level controlled Vital Signs Assessment: post-procedure vital signs reviewed and stable Respiratory status: spontaneous breathing Cardiovascular status: stable Postop Assessment: no apparent nausea or vomiting Anesthetic complications: no     Last Vitals:  Vitals:   04/19/19 1045 04/19/19 1100  BP: (!) 91/50 (!) 95/53  Pulse: (!) 56 64  Resp: 10 16  Temp:    SpO2: 100% 96%    Last Pain:  Vitals:   04/19/19 1100  TempSrc:   PainSc: 3                  ADAMS, AMY A

## 2019-04-22 ENCOUNTER — Other Ambulatory Visit: Payer: Self-pay

## 2019-04-22 ENCOUNTER — Encounter (HOSPITAL_COMMUNITY): Payer: Self-pay | Admitting: General Surgery

## 2019-04-22 ENCOUNTER — Ambulatory Visit (INDEPENDENT_AMBULATORY_CARE_PROVIDER_SITE_OTHER): Payer: PRIVATE HEALTH INSURANCE | Admitting: Gastroenterology

## 2019-04-22 VITALS — Ht 64.0 in | Wt 130.0 lb

## 2019-04-22 DIAGNOSIS — R195 Other fecal abnormalities: Secondary | ICD-10-CM

## 2019-04-25 ENCOUNTER — Telehealth (INDEPENDENT_AMBULATORY_CARE_PROVIDER_SITE_OTHER): Payer: PRIVATE HEALTH INSURANCE | Admitting: General Surgery

## 2019-04-25 DIAGNOSIS — Z09 Encounter for follow-up examination after completed treatment for conditions other than malignant neoplasm: Secondary | ICD-10-CM

## 2019-04-25 NOTE — Telephone Encounter (Signed)
Follow-up conducted by phone per COVID-19 protocol.  Patient states she is doing well.  She has no significant incisional pain.  No fever or chills are noted.  She was instructed to call me should any problems arise.

## 2019-05-02 ENCOUNTER — Encounter: Payer: Self-pay | Admitting: Gastroenterology

## 2019-05-02 ENCOUNTER — Ambulatory Visit (INDEPENDENT_AMBULATORY_CARE_PROVIDER_SITE_OTHER): Payer: PRIVATE HEALTH INSURANCE | Admitting: Gastroenterology

## 2019-05-02 ENCOUNTER — Other Ambulatory Visit: Payer: Self-pay

## 2019-05-02 VITALS — Ht 64.0 in | Wt 130.0 lb

## 2019-05-02 DIAGNOSIS — R195 Other fecal abnormalities: Secondary | ICD-10-CM | POA: Diagnosis not present

## 2019-05-02 MED ORDER — NA SULFATE-K SULFATE-MG SULF 17.5-3.13-1.6 GM/177ML PO SOLN: 1.0000 | ORAL | 0 refills | Status: AC

## 2019-05-02 NOTE — Progress Notes (Signed)
TELEHEALTH VISIT  Referring Provider: No ref. provider found Primary Care Physician:  Patient, No Pcp Per   Tele-visit due to COVID-19 pandemic Patient requested visit virtually, consented to the virtual encounter via video enabled telemedicine application Contact made at: 14:00 05/02/19 Patient verified by name and date of birth Location of patient: Home Location provider: [place] [facility] office Names of persons participating: Me, patient, Tinnie Gens CMA Time spent on telehealth visit: 25 minutes I discussed the limitations of evaluation and management by telemedicine. The patient expressed understanding and agreed to proceed.  Reason for Consultation:  Positive Cologuard   IMPRESSION:  + Cologuard 01/31/19 Lap appendectomy 04/19/19 for acute suppurative appendicitis Anal fissure presenting with rectal bleeding    [facility] Maternal grandmother with colon cancer at age 52  Colonoscopy recommended. Would prefer to wait until at least July 2020 given her recent appendectomy.   PLAN: Colonoscopy in July 2020  I consented the patient discussing the risks, benefits, and alternatives to endoscopic evaluation. In particular, we discussed the risks that include, but are not limited to, reaction to medication, cardiopulmonary compromise, bleeding requiring blood transfusion, aspiration resulting in pneumonia, perforation requiring surgery, lack of diagnosis, severe illness requiring hospitalization, and even death. We reviewed the risk of missed lesion including polyps or even cancer. The patient acknowledges these risks and asks that we proceed.   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 Linda Hedges for + cologuard test 01/31/19. The history is obtained through the patient and review of her electronic medical record. She had laparoscopic appendectomy 04/19/19 for acute suppurative appendicitis. She has recovered without  incident with some mild incisional soreness. She has a history of endometriosis, mitral valve prolapse, and a AVM stroke in 2015.   No symptoms associated with the + cologuard.  No overt GI blood loss. No melena, hematochezia, bright red blood per rectum. No epistaxis, vaginal bleeding, hemoptysis, or hematuria.   Colonoscopy many years ago at a [facility] for bleeding that was attributed to anal fissures.   Maternal grandmother with colon cancer at age 58. Father with kidney cancer.  No other known family history of colon cancer or polyps. No family history of uterine/endometrial cancer, pancreatic cancer or gastric/stomach cancer.  Past Medical History:  Diagnosis Date  . Allergies   . Endometriosis   . Mitral valve prolapse   . Stroke [facility]) 03/22/2014    Past Surgical History:  Procedure Laterality Date  . LAPAROSCOPIC APPENDECTOMY N/A 04/19/2019   Procedure: APPENDECTOMY LAPAROSCOPIC;  Surgeon: Aviva Signs, MD;  Location: [facility];  Service: General;  Laterality: N/A;  . ROOT CANAL    . TEE WITHOUT CARDIOVERSION N/A 03/26/2014   Procedure: TRANSESOPHAGEAL ECHOCARDIOGRAM (TEE);  Surgeon: Larey Dresser, MD;  Location: [place];  Service: Cardiovascular;  Laterality: N/A;  . WISDOM TOOTH EXTRACTION      Current Outpatient Medications  Medication Sig Dispense Refill  . acetaminophen (TYLENOL) 325 MG tablet Take 650 mg by mouth as needed for headache.     . cetirizine (ZYRTEC) 10 MG tablet Take 10 mg by mouth daily as needed for allergies.    . diphenhydrAMINE (BENADRYL) 25 MG tablet Take 25 mg by mouth every 6 (six) hours as needed for allergies or sleep.     Marland Kitchen HYDROcodone-acetaminophen (NORCO) 5-325 MG tablet Take 1 tablet by mouth every 4 (four) hours as needed for moderate pain. 40 tablet 0  .  ibuprofen (ADVIL,MOTRIN) 200 MG tablet Take 200 mg by mouth as needed for headache.     Marland Kitchen LORazepam (ATIVAN) 0.5 MG tablet Take 0.5 mg by mouth every 6 (six) hours as needed for  anxiety.    . Magnesium 250 MG TABS Take by mouth daily.    . NON FORMULARY     . ondansetron (ZOFRAN) 4 MG tablet Take 1 tablet (4 mg total) by mouth every 8 (eight) hours as needed for nausea or vomiting. 20 tablet 1  . tiZANidine (ZANAFLEX) 2 MG tablet Take 1 tablet (2 mg total) by mouth every 8 (eight) hours as needed. (Patient taking differently: Take 2 mg by mouth every 8 (eight) hours as needed for muscle spasms. ) 30 tablet 0   No current facility-administered medications for this visit.     Allergies as of 05/02/2019  . (No Known Allergies)    Family History  Problem Relation Age of Onset  . Hyperlipidemia Mother   . Hypertension Father   . Cancer Father        kidney  . Asthma Daughter        exercise induced  . Heart murmur Son   . Migraines Son   . Diabetes Maternal Grandmother   . Heart disease Maternal Grandmother   . Heart attack Maternal Grandfather   . Heart disease Maternal Grandfather     Social History   Socioeconomic History  . Marital status: Legally Separated    Spouse name: Audry Pili  . Number of children: 4  . Years of education: college  . Highest education level: Not on file  Occupational History  . Occupation: other  Social Needs  . Financial resource strain: Not on file  . Food insecurity    Worry: Not on file    Inability: Not on file  . Transportation needs    Medical: Not on file    Non-medical: Not on file  Tobacco Use  . Smoking status: Never Smoker  . Smokeless tobacco: Never Used  Substance and Sexual Activity  . Alcohol use: Yes    Comment: rarely per pt  . Drug use: No  . Sexual activity: Not Currently  Lifestyle  . Physical activity    Days per week: Not on file    Minutes per session: Not on file  . Stress: Not on file  Relationships  . Social Herbalist on phone: Not on file    Gets together: Not on file    Attends religious service: Not on file    Active member of club or organization: Not on file     Attends meetings of clubs or organizations: Not on file    Relationship status: Not on file  . Intimate partner violence    Fear of current or ex partner: Not on file    Emotionally abused: Not on file    Physically abused: Not on file    Forced sexual activity: Not on file  Other Topics Concern  . Not on file  Social History Narrative  . Not on file    Review of Systems: ALL ROS discussed and all others negative except listed in HPI.  Physical Exam: General: in no acute distress Neuro: Alert and appropriate Psych: Normal affect and normal insight   Telly Broberg L. Tarri Glenn, MD, MPH [facility] 05/02/2019, 12:47 PM

## 2019-05-02 NOTE — Patient Instructions (Signed)
Tips for colonoscopy:  -STAY WELL HYDRATED FOR 3-4 DAYS PRIOR TO THE EXAM. This reduces nausea and dehydration.  -TO PREVENT SKIN/HEMORRHOID IRRITATION- prior to wiping, put A&Dointment or vaseline on the toilet paper. -Keep a towel or pad on the bed.  -DRINK 64oz of clear liquids in the morning of prep day (PRIOR TO STARTING THE PREP) to be sure that there is enough fluid to flush the colon and stay hydrated!!!! This is in addition to the fluids required for preparation.

## 2019-06-22 ENCOUNTER — Encounter: Payer: Managed Care, Other (non HMO) | Admitting: Gastroenterology

## 2019-07-18 ENCOUNTER — Telehealth: Payer: Self-pay

## 2019-07-18 NOTE — Telephone Encounter (Signed)
Covid-19 screening questions   Do you now or have you had a fever in the last 14 days?  Do you have any respiratory symptoms of shortness of breath or cough now or in the last 14 days?  Do you have any family members or close contacts with diagnosed or suspected Covid-19 in the past 14 days?  Have you been tested for Covid-19 and found to be positive?      L/m to c/b.

## 2019-07-18 NOTE — Telephone Encounter (Signed)
Patient answered "NO" to all covid-19 questions °

## 2019-07-19 ENCOUNTER — Emergency Department (HOSPITAL_COMMUNITY)
Admission: EM | Admit: 2019-07-19 | Discharge: 2019-07-19 | Disposition: A | Discharge status: Home / Self Care | Payer: Managed Care, Other (non HMO) | Attending: Emergency Medicine | Admitting: Emergency Medicine

## 2019-07-19 ENCOUNTER — Encounter: Payer: Self-pay | Admitting: Gastroenterology

## 2019-07-19 ENCOUNTER — Emergency Department (HOSPITAL_COMMUNITY): Payer: Managed Care, Other (non HMO)

## 2019-07-19 ENCOUNTER — Other Ambulatory Visit: Payer: Self-pay

## 2019-07-19 ENCOUNTER — Ambulatory Visit (AMBULATORY_SURGERY_CENTER): Payer: Managed Care, Other (non HMO) | Admitting: Gastroenterology

## 2019-07-19 VITALS — BP 158/74 | HR 69 | Temp 99.4°F | Resp 19 | Ht 64.0 in | Wt 130.0 lb

## 2019-07-19 DIAGNOSIS — D120 Benign neoplasm of cecum: Secondary | ICD-10-CM

## 2019-07-19 DIAGNOSIS — Z9889 Other specified postprocedural states: Secondary | ICD-10-CM | POA: Diagnosis not present

## 2019-07-19 DIAGNOSIS — R195 Other fecal abnormalities: Secondary | ICD-10-CM

## 2019-07-19 DIAGNOSIS — R109 Unspecified abdominal pain: Secondary | ICD-10-CM | POA: Diagnosis not present

## 2019-07-19 DIAGNOSIS — Z79899 Other long term (current) drug therapy: Secondary | ICD-10-CM | POA: Diagnosis not present

## 2019-07-19 DIAGNOSIS — K5520 Angiodysplasia of colon without hemorrhage: Secondary | ICD-10-CM | POA: Diagnosis not present

## 2019-07-19 DIAGNOSIS — R140 Abdominal distension (gaseous): Secondary | ICD-10-CM | POA: Insufficient documentation

## 2019-07-19 DIAGNOSIS — R110 Nausea: Secondary | ICD-10-CM | POA: Insufficient documentation

## 2019-07-19 MED ORDER — SODIUM CHLORIDE 0.9 % IV SOLN: 4.0000 mg | Freq: Once | INTRAVENOUS | Status: AC

## 2019-07-19 MED ORDER — SODIUM CHLORIDE 0.9 % IV SOLN: 500.0000 mL | Freq: Once | INTRAVENOUS | Status: AC

## 2019-07-19 MED ORDER — FENTANYL CITRATE (PF) 100 MCG/2ML IJ SOLN
50.0000 ug | INTRAMUSCULAR | Status: DC | PRN
  Administered 2019-07-19: 18:00:00 50 ug via INTRAVENOUS
  Filled 2019-07-19: qty 2

## 2019-07-19 NOTE — ED Triage Notes (Signed)
PT BIBA from [place]   Per EMS- Pt c/o abdominal pain, emesis x2.  Pt able to pass flatus x2 since colonoscopy.   During colonoscopy, one polyp removed. Pt received total 8 mg Zofran, last dose 1719.  Pain worsening, nausea decreasing. Pt received appx 1200 ml NaCl total post procedure.  Pt was undergoing routine colonoscopy.   22 g r hand.

## 2019-07-19 NOTE — Op Note (Addendum)
[place] Patient Name: Regina [name] Procedure Date: 07/19/2019 3:04 PM MRN: [id number] Endoscopist: Thornton Park MD, MD Age: 53 Referring MD:  Date of Birth: [birth year] Gender: Female Account #: [id number] Procedure:                Colonoscopy Indications:              Positive Cologuard test 01/31/19                           Lap appendectomy 04/19/19 for acute suppurative                            appendicitis                           Anal fissure presenting with rectal bleeding                           - [facility]                           Maternal grandmother with colon cancer at age 75+ Medicines:                See the Anesthesia note for documentation of the                            administered medications Procedure:                Pre-Anesthesia Assessment:                           - Prior to the procedure, a History and Physical                            was performed, and patient medications and                            allergies were reviewed. The patient's tolerance of                            previous anesthesia was also reviewed. The risks                            and benefits of the procedure and the sedation                            options and risks were discussed with the patient.                            All questions were answered, and informed consent                            was obtained. Prior Anticoagulants: The patient has  taken no previous anticoagulant or antiplatelet                            agents. ASA Grade Assessment: III - A patient with                            severe systemic disease. After reviewing the risks                            and benefits, the patient was deemed in                            satisfactory condition to undergo the procedure.                           After obtaining informed consent, the colonoscope      was passed under direct vision. Throughout the                            procedure, the patient's blood pressure, pulse, and                            oxygen saturations were monitored continuously. The                            Colonoscope was introduced through the anus and                            advanced to the the terminal ileum, with                            identification of the appendiceal orifice and IC                            valve. A second forward view of the right colon was                            performed. The colonoscopy was technically                            difficult and complex due to restricted mobility of                            the colon, significant looping and a tortuous                            colon. The rectosigmoid junction was relatively                            fixed. Successful completion of the procedure was                            aided by changing the patient to a prone position,  withdrawing the scope and replacing with the adult                            endoscope. With the endoscope, abdominal pressure                            was needed to prevent looping of the colonoscope.                            The patient tolerated the procedure well. The                            quality of the bowel preparation was excellent. The                            ileocecal valve, appendiceal orifice, and rectum                            were photographed. Scope In: 3:18:27 PM Scope Out: 3:47:22 PM Scope Withdrawal Time: 0 hours 9 minutes 12 seconds  Total Procedure Duration: 0 hours 28 minutes 55 seconds  Findings:                 The perianal and digital rectal examinations were                            normal.                           A less than 1 mm polyp was found in the cecum. The                            polyp was sessile. The polyp was removed with a                            cold biopsy  forceps. Resection and retrieval were                            complete. Estimated blood loss was minimal.                           A single angioectasia without bleeding was found in                            the proximal ascending colon.                           The colon is tortuous and redundant. There was                            significant looping of the colonoscope. The exam                            was otherwise without abnormality on direct and  retroflexion views. Complications:            No immediate complications. Estimated blood loss:                            Minimal. Estimated Blood Loss:     Estimated blood loss was minimal. Impression:               - One less than 1 mm polyp in the cecum, removed                            with a cold biopsy forceps. Resected and retrieved.                           - A single non-bleeding colonic angioectasia.                           - The examination was otherwise normal on direct                            and retroflexion views. Recommendation:           - Patient has a contact number available for                            emergencies. The signs and symptoms of potential                            delayed complications were discussed with the                            patient. Return to normal activities tomorrow.                            Written discharge instructions were provided to the                            patient.                           - Resume previous diet today.                           - Continue present medications.                           - Await pathology results.                           - Repeat colonoscopy in 7 years for surveillance if                            the polyp is an adenoma. Thornton Park MD, MD 07/19/2019 3:55:52 PM This report has been signed electronically.

## 2019-07-19 NOTE — Progress Notes (Signed)
Pt's states no medical or surgical changes since previsit or office visit.  [place]

## 2019-07-19 NOTE — Progress Notes (Addendum)
Per Dr. Tarri Glenn, pt has a long, twisted colon in a small abdomin.  Pt was given LEVSIN 0.25 mg SL for abdominal cramping. Maw  Dr. Tarri Glenn in and listen to abdomin. maw   Pt c/o nausea and vomiting behind nausea.  16:26 pt zofran 4 mg IV. Diluted in 20 ml of saline.  After nausea decreased, pt up to the restroom.  Pt and still unable to pass any flatus.  Hung a second 1,00 ml bag of NaChl 0.9% fluids.  Dr. Tarri Glenn back in and she spoke with pt and gave vocal orders to send pt to [facility] ED. Pt rating pain 10/10. maw  Pt vomited 2 nd time.  She did pass flatus 2 x while waiting for EMS.  Pt reported nausea was also better. Dr. Tarri Glenn spoke with pt's daughter via phone as the daughter, Vilma Prader was in the car in the parking lot. She came up to the recovery room and did see her mother before pt was tranported.  Rush Barer, RN went over discharge instructions with pt's daughter.  AVS was given to her daughter. Pt was transported by EMS at 1700 along with her belongings bag. maw

## 2019-07-19 NOTE — Progress Notes (Signed)
Called to room to assist during endoscopic procedure.  Patient ID and intended procedure confirmed with present staff. Received instructions for my participation in the procedure from the performing physician.  

## 2019-07-19 NOTE — ED Notes (Signed)
Patient ambulated to RR and back without assistance, no distress noted.

## 2019-07-19 NOTE — Progress Notes (Signed)
To PACU, VSS. Report to RN.tb 

## 2019-07-19 NOTE — ED Provider Notes (Signed)
[facility] DEPT Provider Note   CSN: [id number] Arrival date & time: 07/19/19  1717     History   Chief Complaint Chief Complaint  Patient presents with   Abdominal Pain    HPI Regina [name] is a 53 y.o. female.     HPI   53 year old female with abdominal pain.  She is status post colonoscopy earlier today.  Postop she is complaining of a lot of abdominal pain and is referred to the emergency room for further evaluation.  She has been passing some gas since the procedure.  Symptoms have mildly improved but still persisting.  Feels nauseated.  Past Medical History:  Diagnosis Date   Allergies    Endometriosis    Mitral valve prolapse    Stroke ([place]) 03/22/2014    Patient Active Problem List   Diagnosis Date Noted   Follow-up examination following surgery 04/25/2019   Acute appendicitis    SAH (subarachnoid hemorrhage) ([place])    Ovarian mass 04/25/2014   CVA (cerebral infarction) 04/01/2014   Hemiparesis affecting left side as late effect of stroke ([place]) 04/01/2014   Lack of coordination 04/01/2014   Muscle weakness (generalized) 04/01/2014   Stroke ([place]) 03/24/2014    Past Surgical History:  Procedure Laterality Date   LAPAROSCOPIC APPENDECTOMY N/A 04/19/2019   Procedure: APPENDECTOMY LAPAROSCOPIC;  Surgeon: Aviva Signs, MD;  Location: [facility] ORS;  Service: General;  Laterality: N/A;   ROOT CANAL     TEE WITHOUT CARDIOVERSION N/A 03/26/2014   Procedure: TRANSESOPHAGEAL ECHOCARDIOGRAM (TEE);  Surgeon: Larey Dresser, MD;  Location: [facility] ENDOSCOPY;  Service: Cardiovascular;  Laterality: N/A;   WISDOM TOOTH EXTRACTION       OB History    Gravida  5   Para  4   Term  4   Preterm      AB  1   Living  4     SAB  1   TAB      Ectopic      Multiple      Live Births  4            Home Medications    Prior to Admission medications   Medication Sig Start Date End Date Taking? Authorizing Provider    acetaminophen (TYLENOL) 325 MG tablet Take 650 mg by mouth as needed for headache.    Yes [provider]  Calcium Citrate-Vitamin D (CALCIUM + D PO) Take 1 tablet by mouth daily.   Yes [provider]  diphenhydrAMINE (BENADRYL) 25 MG tablet Take 25 mg by mouth every 6 (six) hours as needed for allergies or sleep.    Yes [provider]  ibuprofen (ADVIL,MOTRIN) 200 MG tablet Take 200 mg by mouth as needed for headache.    Yes [provider]  Magnesium 250 MG TABS Take by mouth daily.   Yes [provider]  Multiple Vitamins-Minerals (MULTI + OMEGA-3 ADULT GUMMIES PO) Take 2 Units by mouth daily.   Yes [provider]  OVER THE COUNTER MEDICATION as directed. "Flash Away" for hot flashes.   Yes [provider]  PREMARIN vaginal cream Place 1 application vaginally once a week. 03/07/19  Yes [provider]  HYDROcodone-acetaminophen (NORCO) 5-325 MG tablet Take 1 tablet by mouth every 4 (four) hours as needed for moderate pain. Patient not taking: Reported on 07/19/2019 04/19/19   Aviva Signs, MD  ondansetron (ZOFRAN) 4 MG tablet Take 1 tablet (4 mg total) by mouth every 8 (  eight) hours as needed for nausea or vomiting. Patient not taking: Reported on 07/19/2019 04/19/19   Aviva Signs, MD  tiZANidine (ZANAFLEX) 2 MG tablet Take 1 tablet (2 mg total) by mouth every 8 (eight) hours as needed. Patient not taking: Reported on 07/19/2019 10/07/14   Philmore Pali, NP    Family History Family History  Problem Relation Age of Onset   Hyperlipidemia Mother    Hypertension Father    Cancer Father        kidney   Asthma Daughter        exercise induced   Heart murmur Son    Migraines Son    Diabetes Maternal Grandmother    Heart disease Maternal Grandmother    Colon cancer Maternal Grandmother    Heart attack Maternal Grandfather    Heart disease Maternal Grandfather    Esophageal cancer Neg Hx    Stomach cancer  Neg Hx    Rectal cancer Neg Hx     Social History Social History   Tobacco Use   Smoking status: Never Smoker   Smokeless tobacco: Never Used  Substance Use Topics   Alcohol use: Yes    Comment: rarely per pt   Drug use: No     Allergies   Patient has no known allergies.   Review of Systems Review of Systems All systems reviewed and negative, other than as noted in HPI.   Physical Exam Updated Vital Signs BP 113/64 (BP Location: Left Arm)    Pulse 64    Temp 98.2 F (36.8 C) (Oral)    Resp 16    LMP 02/15/2015    SpO2 98%   Physical Exam Vitals signs and nursing note reviewed.  Constitutional:      General: She is not in acute distress.    Appearance: She is well-developed.  HENT:     Head: Normocephalic and atraumatic.  Eyes:     General:        Right eye: No discharge.        Left eye: No discharge.     Conjunctiva/sclera: Conjunctivae normal.  Neck:     Musculoskeletal: Neck supple.  Cardiovascular:     Rate and Rhythm: Normal rate and regular rhythm.     Heart sounds: Normal heart sounds. No murmur. No friction rub. No gallop.   Pulmonary:     Effort: Pulmonary effort is normal. No respiratory distress.     Breath sounds: Normal breath sounds.  Abdominal:     General: There is no distension.     Palpations: Abdomen is soft.     Tenderness: There is no abdominal tenderness.     Comments: Abdomen flat.  No tenderness.   Musculoskeletal:        General: No tenderness.  Skin:    General: Skin is warm and dry.  Neurological:     Mental Status: She is alert.  Psychiatric:        Behavior: Behavior normal.        Thought Content: Thought content normal.      ED Treatments / Results  Labs (all labs ordered are listed, but only abnormal results are displayed) Labs Reviewed - No data to display  EKG None  Radiology Dg Abdomen Acute W/chest  Result Date: 07/19/2019 CLINICAL DATA:  Increasing abdominal pain and nausea following colonoscopy.  EXAM: DG ABDOMEN ACUTE W/ 1V CHEST COMPARISON:  None. FINDINGS: The cardiomediastinal silhouette is unremarkable. Ill-defined opacity overlying the mid LEFT lung may  represent atelectasis or mild airspace disease. No pleural effusion or pneumothorax. Gas in the colon and rectum noted. The ascending colon/cecum measures up to 9 cm. There is no evidence of pneumoperitoneum. No dilated small bowel loops are present. IMPRESSION: 1. Gas within the colon and rectum not unexpected given recent colonoscopy. Ascending colon/cecum measures up to 9 cm. No evidence of pneumoperitoneum. 2. Ill-defined mid LEFT lung opacity which may represent atelectasis or mild airspace disease. Electronically Signed   By: Margarette Canada M.D.   On: 07/19/2019 20:10    Procedures Procedures (including critical care time)  Medications Ordered in ED Medications  fentaNYL (SUBLIMAZE) injection 50 mcg (50 mcg Intravenous Given 07/19/19 1820)     Initial Impression / Assessment and Plan / ED Course  I have reviewed the triage vital signs and the nursing notes.  Pertinent labs & imaging results that were available during my care of the patient were reviewed by me and considered in my medical decision making (see chart for details).  Symptoms progressively improving. Exam pretty benign. Imaging w/ no evidence of perforation. Gaseous distension that is not unexpected and likely cause of symptoms. Walking in the ED and seems reasonably comfortable. I have a very low suspicion for perforation or other emergent process. I think she is appropriate for DC at this time. Detailed emergent return precautions to her and daughter. FU with GI as recommended otherwise.   Final Clinical Impressions(s) / ED Diagnoses   Final diagnoses:  Abdominal pain, unspecified abdominal location  S/P colonoscopy    ED Discharge Orders    None       Virgel Manifold, MD 07/22/19 1141

## 2019-07-19 NOTE — Patient Instructions (Signed)
YOU HAD AN ENDOSCOPIC PROCEDURE TODAY AT [facility]:   Refer to the procedure report that was given to you for any specific questions about what was found during the examination.  If the procedure report does not answer your questions, please call your gastroenterologist to clarify.  If you requested that your care partner not be given the details of your procedure findings, then the procedure report has been included in a sealed envelope for you to review at your convenience later.  YOU SHOULD EXPECT: Some feelings of bloating in the abdomen. Passage of more gas than usual.  Walking can help get rid of the air that was put into your GI tract during the procedure and reduce the bloating. If you had a lower endoscopy (such as a colonoscopy or flexible sigmoidoscopy) you may notice spotting of blood in your stool or on the toilet paper. If you underwent a bowel prep for your procedure, you may not have a normal bowel movement for a few days.  Please Note:  You might notice some irritation and congestion in your nose or some drainage.  This is from the oxygen used during your procedure.  There is no need for concern and it should clear up in a day or so.  SYMPTOMS TO REPORT IMMEDIATELY:   Following lower endoscopy (colonoscopy or flexible sigmoidoscopy):  Excessive amounts of blood in the stool  Significant tenderness or worsening of abdominal pains  Swelling of the abdomen that is new, acute  Fever of 100F or higher    For urgent or emergent issues, a gastroenterologist can be reached at any hour by calling [phone].   DIET:  We do recommend a small meal at first, but then you may proceed to your regular diet.  Drink plenty of fluids but you should avoid alcoholic beverages for 24 hours.  ACTIVITY:  You should plan to take it easy for the rest of today and you should NOT DRIVE or use heavy machinery until tomorrow (because of the sedation medicines used during the test).     FOLLOW UP: Our staff will call the number listed on your records 48-72 hours following your procedure to check on you and address any questions or concerns that you may have regarding the information given to you following your procedure. If we do not reach you, we will leave a message.  We will attempt to reach you two times.  During this call, we will ask if you have developed any symptoms of COVID 19. If you develop any symptoms (ie: fever, flu-like symptoms, shortness of breath, cough etc.) before then, please call [phone].  If you test positive for Covid 19 in the 2 weeks post procedure, please call and report this information to [place].    If any biopsies were taken you will be contacted by phone or by letter within the next 1-3 weeks.  Please call us at [phone] if you have not heard about the biopsies in 3 weeks.    SIGNATURES/CONFIDENTIALITY: You and/or your care partner have signed paperwork which will be entered into your electronic medical record.  These signatures attest to the fact that that the information above on your After Visit Summary has been reviewed and is understood.  Full responsibility of the confidentiality of this discharge information lies with you and/or your care-partner.    Handouts were given to you on polyps, diverticulosis, and a high fiber diet with liberal fluid intake You may resume your current medications  today. Await biopsy results. Please call if any questions or concerns.

## 2019-07-23 ENCOUNTER — Telehealth: Payer: Self-pay

## 2019-07-23 NOTE — Telephone Encounter (Signed)
Thanks for the follow-up. Regina [name], as I am out of the office, would you please call and check on her this afternoon? Thank you.

## 2019-07-23 NOTE — Telephone Encounter (Signed)
  Follow up Call-  Call back number 07/19/2019  Post procedure Call Back phone  # [phone]  Permission to leave phone message Yes  Some recent data might be hidden     Patient questions:  Do you have a fever, pain , or abdominal swelling? No. Pain Score  0 *  Have you tolerated food without any problems? Yes.    Have you been able to return to your normal activities? Yes.    Do you have any questions about your discharge instructions: Diet   No. Medications  No. Follow up visit  No.  Do you have questions or concerns about your Care? Yes.    Actions: * If pain score is 4 or above: Physician/ provider Notified : Joelene Millin L. Tarri Glenn, MD   1. Have you developed a fever since your procedure? no  2.   Have you had an respiratory symptoms (SOB or cough) since your procedure? no  3.   Have you tested positive for COVID 19 since your procedure no  4.   Have you had any family members/close contacts diagnosed with the COVID 19 since your procedure?  no   If yes to any of these questions please route to Joylene John, RN and Alphonsa Gin, Therapist, sports. Marland Kitchen

## 2019-07-24 NOTE — Telephone Encounter (Signed)
Patient is feeling better. States her head is still "foggy" but thinks its "tension in my neck." No other complaints voiced.

## 2019-07-25 ENCOUNTER — Encounter: Payer: Self-pay | Admitting: Gastroenterology

## 2021-03-20 ENCOUNTER — Encounter (HOSPITAL_COMMUNITY): Payer: Self-pay

## 2021-03-20 ENCOUNTER — Emergency Department (HOSPITAL_COMMUNITY)
Admission: EM | Admit: 2021-03-20 | Discharge: 2021-03-20 | Disposition: A | Discharge status: Home / Self Care | Payer: No Typology Code available for payment source | Attending: Emergency Medicine | Admitting: Emergency Medicine

## 2021-03-20 ENCOUNTER — Other Ambulatory Visit: Payer: Self-pay

## 2021-03-20 ENCOUNTER — Emergency Department (HOSPITAL_COMMUNITY): Payer: No Typology Code available for payment source

## 2021-03-20 DIAGNOSIS — R1110 Vomiting, unspecified: Secondary | ICD-10-CM | POA: Insufficient documentation

## 2021-03-20 DIAGNOSIS — Z20822 Contact with and (suspected) exposure to covid-19: Secondary | ICD-10-CM | POA: Insufficient documentation

## 2021-03-20 DIAGNOSIS — R42 Dizziness and giddiness: Secondary | ICD-10-CM | POA: Diagnosis present

## 2021-03-20 LAB — CBC WITH DIFFERENTIAL/PLATELET
Abs Immature Granulocytes: 0.26 10*3/uL — ABNORMAL HIGH (ref 0.00–0.07)
Basophils Absolute: 0.2 10*3/uL — ABNORMAL HIGH (ref 0.0–0.1)
Basophils Relative: 1 %
Eosinophils Absolute: 0.1 10*3/uL (ref 0.0–0.5)
Eosinophils Relative: 1 %
HCT: 45.3 % (ref 36.0–46.0)
Hemoglobin: 15.3 g/dL — ABNORMAL HIGH (ref 12.0–15.0)
Immature Granulocytes: 2 %
Lymphocytes Relative: 7 %
Lymphs Abs: 0.9 10*3/uL (ref 0.7–4.0)
MCH: 33.9 pg (ref 26.0–34.0)
MCHC: 33.8 g/dL (ref 30.0–36.0)
MCV: 100.4 fL — ABNORMAL HIGH (ref 80.0–100.0)
Monocytes Absolute: 0.2 10*3/uL (ref 0.1–1.0)
Monocytes Relative: 2 %
Neutro Abs: 12 10*3/uL — ABNORMAL HIGH (ref 1.7–7.7)
Neutrophils Relative %: 87 %
Platelets: 548 10*3/uL — ABNORMAL HIGH (ref 150–400)
RBC: 4.51 MIL/uL (ref 3.87–5.11)
RDW: 13.8 % (ref 11.5–15.5)
WBC: 13.6 10*3/uL — ABNORMAL HIGH (ref 4.0–10.5)
nRBC: 0 % (ref 0.0–0.2)

## 2021-03-20 LAB — RESP PANEL BY RT-PCR (FLU A&B, COVID) ARPGX2
Influenza A by PCR: NEGATIVE
Influenza B by PCR: NEGATIVE
SARS Coronavirus 2 by RT PCR: NEGATIVE

## 2021-03-20 LAB — BASIC METABOLIC PANEL
Anion gap: 7 (ref 5–15)
BUN: 15 mg/dL (ref 6–20)
CO2: 26 mmol/L (ref 22–32)
Calcium: 8.8 mg/dL — ABNORMAL LOW (ref 8.9–10.3)
Chloride: 104 mmol/L (ref 98–111)
Creatinine, Ser: 0.55 mg/dL (ref 0.44–1.00)
GFR, Estimated: >60 mL/min (ref >60)
Glucose, Bld: 133 mg/dL — ABNORMAL HIGH (ref 70–99)
Potassium: 3.7 mmol/L (ref 3.5–5.1)
Sodium: 137 mmol/L (ref 135–145)

## 2021-03-20 LAB — CBG MONITORING, ED: Glucose-Capillary: 125 mg/dL — ABNORMAL HIGH (ref 70–99)

## 2021-03-20 LAB — ETHANOL: Alcohol, Ethyl (B): <10 mg/dL (ref <10)

## 2021-03-20 LAB — MAGNESIUM: Magnesium: 2 mg/dL (ref 1.7–2.4)

## 2021-03-20 MED ORDER — SODIUM CHLORIDE 0.9 % IV BOLUS
1000.0000 mL | Freq: Once | INTRAVENOUS | Status: AC
  Administered 2021-03-20: 1000 mL via INTRAVENOUS

## 2021-03-20 MED ORDER — ONDANSETRON 4 MG PO TBDP: ORAL_TABLET | ORAL | 0 refills | Status: DC

## 2021-03-20 MED ORDER — MECLIZINE HCL 25 MG PO TABS: 25.0000 mg | ORAL_TABLET | Freq: Three times a day (TID) | ORAL | 0 refills | Status: DC | PRN

## 2021-03-20 MED ORDER — IOHEXOL 350 MG/ML SOLN
75.0000 mL | Freq: Once | INTRAVENOUS | Status: AC | PRN
  Administered 2021-03-20: 75 mL via INTRAVENOUS

## 2021-03-20 MED ORDER — METOCLOPRAMIDE HCL 5 MG/ML IJ SOLN
10.0000 mg | Freq: Once | INTRAMUSCULAR | Status: AC
  Administered 2021-03-20: 10 mg via INTRAVENOUS
  Filled 2021-03-20: qty 2

## 2021-03-20 NOTE — ED Provider Notes (Signed)
[facility] EMERGENCY DEPARTMENT Provider Note   CSN: [id number] Arrival date & time: 03/20/21  1113     History Chief Complaint  Patient presents with  . Dizziness    Regina [name] is a 55 y.o. female.  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  This is different than her previous presentation.  Patient does feel off balance, no unilateral signs or symptoms.  No headache the past week.  No fevers chills or other systemic symptoms.  No new medications.  Patient denies any current chest pain or shortness of breath.        Past Medical History:  Diagnosis Date  . Allergies   . Endometriosis   . Mitral valve prolapse   . Stroke [facility]) 03/22/2014    Patient Active Problem List   Diagnosis Date Noted  . Follow-up examination following surgery 04/25/2019  . Acute appendicitis   . SAH (subarachnoid hemorrhage) ([place])   . Ovarian mass 04/25/2014  . CVA (cerebral infarction) 04/01/2014  . Hemiparesis affecting left side as late effect of stroke ([place]) 04/01/2014  . Lack of coordination 04/01/2014  . Muscle weakness (generalized) 04/01/2014  . Stroke [facility]) 03/24/2014    Past Surgical History:  Procedure Laterality Date  . LAPAROSCOPIC APPENDECTOMY N/A 04/19/2019   Procedure: APPENDECTOMY LAPAROSCOPIC;  Surgeon: Aviva Signs, MD;  Location: [facility] ORS;  Service: General;  Laterality: N/A;  . ROOT CANAL    . TEE WITHOUT CARDIOVERSION N/A 03/26/2014   Procedure: TRANSESOPHAGEAL ECHOCARDIOGRAM (TEE);  Surgeon: Larey Dresser, MD;  Location: [facility] ENDOSCOPY;  Service: Cardiovascular;  Laterality: N/A;  . WISDOM TOOTH EXTRACTION       OB History    Gravida  5   Para  4   Term  4   Preterm      AB  1   Living  4     SAB  1   IAB      Ectopic      Multiple      Live Births  4           Family History  Problem Relation Age of Onset  .  Hyperlipidemia Mother   . Hypertension Father   . Cancer Father        kidney  . Asthma Daughter        exercise induced  . Heart murmur Son   . Migraines Son   . Diabetes Maternal Grandmother   . Heart disease Maternal Grandmother   . Colon cancer Maternal Grandmother   . Heart attack Maternal Grandfather   . Heart disease Maternal Grandfather   . Esophageal cancer Neg Hx   . Stomach cancer Neg Hx   . Rectal cancer Neg Hx     Social History   Tobacco Use  . Smoking status: Never Smoker  . Smokeless tobacco: Never Used  Vaping Use  . Vaping Use: Never used  Substance Use Topics  . Alcohol use: Yes    Comment: rarely per pt  . Drug use: No    Home Medications Prior to Admission medications   Medication Sig Start Date End Date Taking? Authorizing Provider  acetaminophen (TYLENOL) 325 MG tablet Take 650 mg by mouth as needed for headache.    Yes [provider]  diphenhydrAMINE (BENADRYL) 25 MG tablet Take 25 mg by mouth every 6 (six) hours as needed for allergies or sleep.  Yes [provider]  estradiol (ESTRACE) 0.1 MG/GM vaginal cream Place 1 Applicatorful vaginally once a week. 03/01/21  Yes [provider]  ibuprofen (ADVIL,MOTRIN) 200 MG tablet Take 200 mg by mouth as needed for headache.    Yes [provider]  Magnesium 250 MG TABS Take by mouth daily.   Yes [provider]  meclizine (ANTIVERT) 25 MG tablet Take 1 tablet (25 mg total) by mouth 3 (three) times daily as needed for dizziness. 03/20/21  Yes Elnora Morrison, MD  ondansetron (ZOFRAN ODT) 4 MG disintegrating tablet 4mg  ODT q4 hours prn nausea/vomit 03/20/21  Yes Elnora Morrison, MD  Calcium Citrate-Vitamin D (CALCIUM + D PO) Take 1 tablet by mouth daily. Patient not taking: Reported on 03/20/2021    [provider]  HYDROcodone-acetaminophen (NORCO) 5-325 MG tablet Take 1 tablet by mouth every 4 (four) hours as needed for moderate pain. Patient not taking:  Reported on 07/19/2019 04/19/19   Aviva Signs, MD  Multiple Vitamins-Minerals (MULTI + OMEGA-3 ADULT GUMMIES PO) Take 2 Units by mouth daily. Patient not taking: Reported on 03/20/2021    [provider]  ondansetron (ZOFRAN) 4 MG tablet Take 1 tablet (4 mg total) by mouth every 8 (eight) hours as needed for nausea or vomiting. Patient not taking: Reported on 07/19/2019 04/19/19   Aviva Signs, MD  OVER THE COUNTER MEDICATION as directed. "Flash Away" for hot flashes. Patient not taking: Reported on 03/20/2021    [provider]  PREMARIN vaginal cream Place 1 application vaginally once a week. Patient not taking: Reported on 03/20/2021 03/07/19   [provider]  tiZANidine (ZANAFLEX) 2 MG tablet Take 1 tablet (2 mg total) by mouth every 8 (eight) hours as needed. Patient not taking: Reported on 07/19/2019 10/07/14   Philmore Pali, NP    Allergies    Patient has no known allergies.  Review of Systems   Review of Systems  Constitutional: Negative for chills and fever.  HENT: Negative for congestion.   Eyes: Negative for visual disturbance.  Respiratory: Negative for shortness of breath.   Cardiovascular: Negative for chest pain.  Gastrointestinal: Positive for nausea and vomiting. Negative for abdominal pain.  Genitourinary: Negative for dysuria and flank pain.  Musculoskeletal: Negative for back pain, neck pain and neck stiffness.  Skin: Negative for rash.  Neurological: Positive for dizziness and light-headedness. Negative for weakness and headaches.    Physical Exam Updated Vital Signs BP (!) 112/59   Pulse 66   Temp (!) 96.9 F (36.1 C) (Rectal)   Resp 20   Ht 5\' 4"  (1.626 m)   Wt 68 kg   LMP 02/15/2015   SpO2 93%   BMI 25.75 kg/m   Physical Exam Vitals and nursing note reviewed.  Constitutional:      Appearance: She is well-developed.  HENT:     Head: Normocephalic and atraumatic.     Mouth/Throat:     Mouth: Mucous membranes are dry.  Eyes:      General:        Right eye: No discharge.        Left eye: No discharge.     Conjunctiva/sclera: Conjunctivae normal.  Neck:     Trachea: No tracheal deviation.  Cardiovascular:     Rate and Rhythm: Normal rate and regular rhythm.  Pulmonary:     Effort: Pulmonary effort is normal.     Breath sounds: Normal breath sounds.  Abdominal:     General: There is no  distension.     Palpations: Abdomen is soft.     Tenderness: There is no abdominal tenderness. There is no guarding.  Musculoskeletal:        General: Normal range of motion.     Cervical back: Normal range of motion and neck supple.  Skin:    General: Skin is warm.     Findings: No rash.  Neurological:     General: No focal deficit present.     Mental Status: She is alert and oriented to person, place, and time.     Comments: Patient has 5+ strength upper and lower extremities equal bilateral, finger-nose intact, coronation intact, extraocular muscle function intact.     ED Results / Procedures / Treatments   Labs (all labs ordered are listed, but only abnormal results are displayed) Labs Reviewed  BASIC METABOLIC PANEL - Abnormal; Notable for the following components:      Result Value   Glucose, Bld 133 (*)    Calcium 8.8 (*)    All other components within normal limits  CBC WITH DIFFERENTIAL/PLATELET - Abnormal; Notable for the following components:   WBC 13.6 (*)    Hemoglobin 15.3 (*)    MCV 100.4 (*)    Platelets 548 (*)    Neutro Abs 12.0 (*)    Basophils Absolute 0.2 (*)    Abs Immature Granulocytes 0.26 (*)    All other components within normal limits  CBG MONITORING, ED - Abnormal; Notable for the following components:   Glucose-Capillary 125 (*)    All other components within normal limits  RESP PANEL BY RT-PCR (FLU A&B, COVID) ARPGX2  ETHANOL  MAGNESIUM  URINALYSIS, ROUTINE W REFLEX MICROSCOPIC    EKG EKG Interpretation  Date/Time:  Saturday March 20 2021 11:34:27 EDT Ventricular Rate:   58 PR Interval:  186 QRS Duration: 93 QT Interval:  463 QTC Calculation: 455 R Axis:   95 Text Interpretation: Sinus rhythm Multiple ventricular premature complexes Probable left atrial enlargement Borderline right axis deviation Confirmed by Elnora Morrison [phone]) on 03/20/2021 2:18:19 PM   Radiology No results found.  Procedures Procedures   Medications Ordered in ED Medications  sodium chloride 0.9 % bolus 1,000 mL (0 mLs Intravenous Stopped 03/20/21 1509)  metoCLOPramide (REGLAN) injection 10 mg (10 mg Intravenous Given 03/20/21 1242)  iohexol (OMNIPAQUE) 350 MG/ML injection 75 mL (75 mLs Intravenous Contrast Given 03/20/21 1318)    ED Course  I have reviewed the triage vital signs and the nursing notes.  Pertinent labs & imaging results that were available during my care of the patient were reviewed by me and considered in my medical decision making (see chart for details).    MDM Rules/Calculators/A&P                          Patient with history of significant stroke, subarachnoid hemorrhage presents with vertigo and balance changes the past week.  Patient has mild symptoms in the room, patient not within any time window for acute stroke, no unilateral symptoms or facial droop.  Discussed broad differential diagnosis including peripheral vertigo, central vertigo, stroke, bleeding, infectious, metabolic, other.  Blood work reviewed showing mild leukocytosis 13,000, normal hemoglobin 15.  IV fluid bolus and Reglan given for symptoms.  CT angiogram ordered and pending results. PO fluid and ambulation trial in ED.   On reassessment patient well-appearing, symptoms improved.  Discussed with radiology, delay in CT scan results, no acute abnormality including no bleeding,  no significant stroke. Plan for close outpatient follow-up with primary care doctor and neurology.  Oral medications prescribed as needed.  COVID test negative.  Urinalysis no signs of infection.  Final Clinical  Impression(s) / ED Diagnoses Final diagnoses:  Vertigo  Vomiting adult  Rx / DC Orders ED Discharge Orders         Ordered    meclizine (ANTIVERT) 25 MG tablet  3 times daily PRN        03/20/21 1534    ondansetron (ZOFRAN ODT) 4 MG disintegrating tablet        03/20/21 1534           Elnora Morrison, MD 03/20/21 1538

## 2021-03-20 NOTE — ED Triage Notes (Signed)
Pt presents to ED via [place] for dizziness x 1 week, nausea and vomiting started today. Zofran 4 mg IM and Zofran 4 mg IV PTA, 500 ml NS bolus. 20 g L FA.

## 2021-03-20 NOTE — Discharge Instructions (Addendum)
Follow-up closely with your primary care doctor.   Your CT scan did not show any signs of stroke or bleeding. Return for new or worsening signs or symptoms.  Take Zofran as needed for nausea and vomiting.  Try meclizine for vertigo symptoms.

## 2021-03-20 NOTE — ED Notes (Signed)
Multiple warm blankets placed on patient. °

## 2021-03-20 NOTE — ED Notes (Signed)
Pt ambulated in ED with no issues. Pt states she feels better.

## 2021-03-20 NOTE — ED Notes (Signed)
Pt put on purewick.

## 2021-05-04 IMAGING — CT CT ANGIO HEAD
1 of 11 series · 5 of 33 positions shown · IV contrast (Omnipaque or Isovue)
Comparison: 12/04/2016

CLINICAL DATA: Dizziness over the last week.  Nausea and vomiting.

EXAM:
CT ANGIOGRAPHY HEAD AND NECK
TECHNIQUE: Multidetector CT imaging of the head and neck was performed using
the standard protocol during bolus administration of intravenous
contrast. Multiplanar CT image reconstructions and MIPs were
obtained to evaluate the vascular anatomy. Carotid stenosis
measurements (when applicable) are obtained utilizing NASCET
criteria, using the distal internal carotid diameter as the
denominator.
CONTRAST:  75mL OMNIPAQUE IOHEXOL 350 MG/ML SOLN

[Series 11: ax thins · axial · 0.39mm/px · z∈[-103,+136]mm · 5 of 362 slices shown]
[im 61/362  soft-tissue]
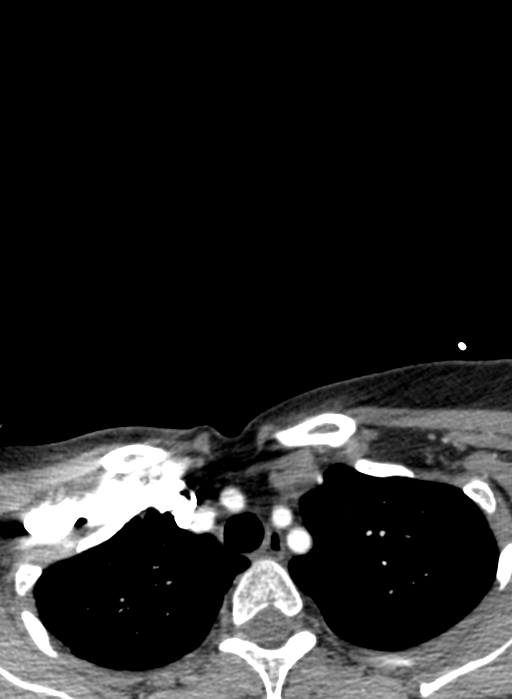
[im 121/362  bone]
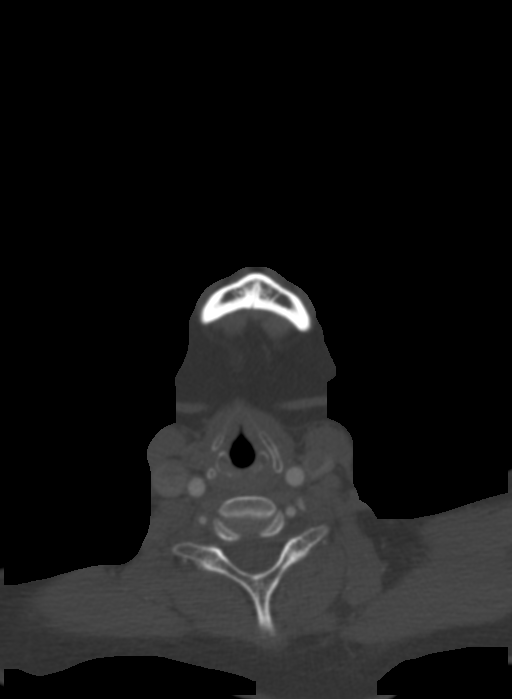
[im 181/362  soft-tissue]
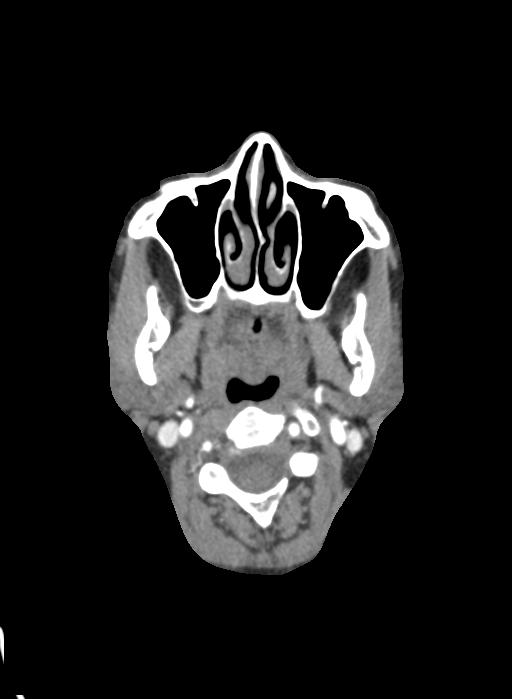
[im 241/362  bone]
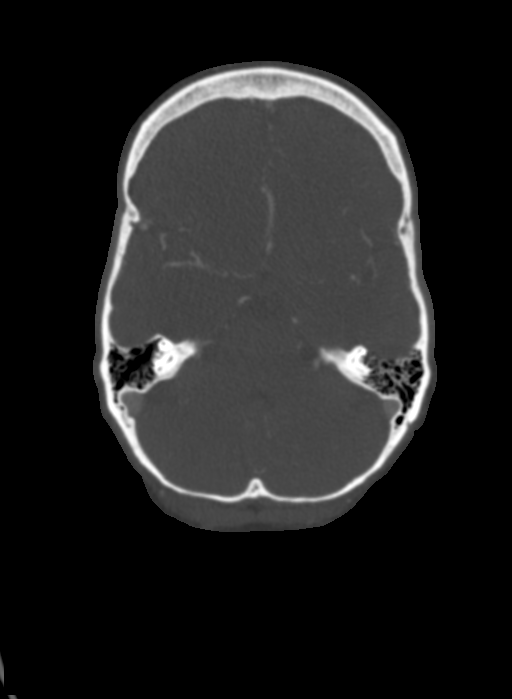
[im 301/362  soft-tissue]
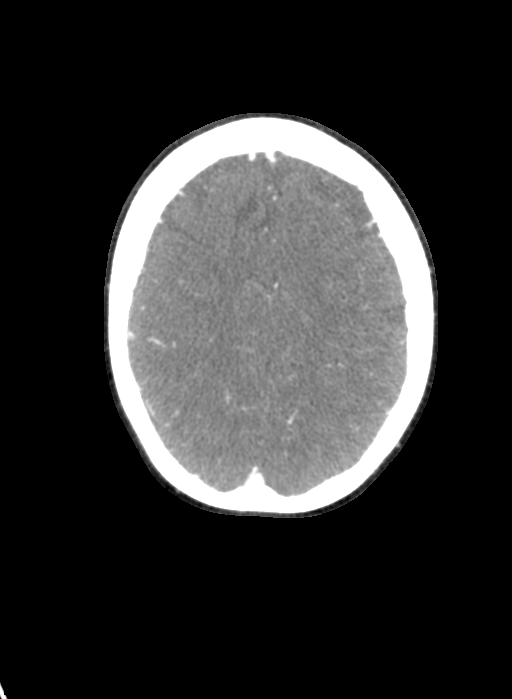

[5 of 33 positions shown; findings below may reference images not displayed]

FINDINGS: CT HEAD

Brain: The brainstem and cerebellum are normal. Left cerebral
hemisphere is normal. Encephalomalacia at the right frontal vertex
is unchanged. No sign of acute infarction, mass lesion, hemorrhage,
hydrocephalus or extra-axial collection.

Vascular: No abnormal vascular finding.

Skull: Negative

Sinuses: Clear

Other: Normal

CTA NECK FINDINGS

Aortic arch: Aortic arch is normal.  Branching pattern is normal.

Right carotid system: Common carotid artery widely patent to the
bifurcation. Carotid bifurcation is normal. Cervical ICA is normal.

Left carotid system: Common carotid artery widely patent to the
bifurcation. Carotid bifurcation is normal. Cervical ICA is normal.

Vertebral arteries: Both vertebral artery origins widely patent.
Both vertebral arteries are normal through the cervical region to
the foramen magnum.

Skeleton: Minimal mid cervical spondylosis.

Other neck: No mass or lymphadenopathy.

Upper chest: Small groupings of tree in bud nodules in both upper
lobes that could be chronic or indicate atypical infection.

Review of the MIP images confirms the above findings

CTA HEAD FINDINGS

Anterior circulation: Both internal carotid arteries are patent
through the skull base and siphon regions. The anterior and middle
cerebral vessels are normal. No large or medium vessel occlusion,
proximal stenosis, aneurysm or vascular malformation.

Posterior circulation: Both vertebral arteries are patent to the
basilar. No basilar stenosis. Posterior circulation branch vessels
are patent.

Venous sinuses: Patent

Anatomic variants: None

Review of the MIP images confirms the above findings
IMPRESSION: Negative exams. No intracranial large or medium vessel occlusion. No
atherosclerotic change at the arch or either carotid bifurcation.
Head CT does not show any acute finding. There is some chronic
encephalomalacia at the right frontal vertex.

## 2021-07-09 ENCOUNTER — Other Ambulatory Visit: Payer: Self-pay

## 2021-07-09 ENCOUNTER — Inpatient Hospital Stay (HOSPITAL_COMMUNITY)
Payer: No Typology Code available for payment source | Attending: Hematology and Oncology | Admitting: Hematology and Oncology

## 2021-07-09 ENCOUNTER — Encounter (HOSPITAL_COMMUNITY): Payer: Self-pay | Admitting: Hematology and Oncology

## 2021-07-09 ENCOUNTER — Inpatient Hospital Stay (HOSPITAL_COMMUNITY): Payer: No Typology Code available for payment source

## 2021-07-09 VITALS — BP 123/68 | HR 68 | Temp 98.3°F | Resp 16 | Ht 64.0 in | Wt 132.8 lb

## 2021-07-09 DIAGNOSIS — D75839 Thrombocytosis, unspecified: Secondary | ICD-10-CM

## 2021-07-09 LAB — COMPREHENSIVE METABOLIC PANEL
ALT: 14 U/L (ref 0–44)
AST: 15 U/L (ref 15–41)
Albumin: 4.1 g/dL (ref 3.5–5.0)
Alkaline Phosphatase: 68 U/L (ref 38–126)
Anion gap: 6 (ref 5–15)
BUN: 16 mg/dL (ref 6–20)
CO2: 29 mmol/L (ref 22–32)
Calcium: 9 mg/dL (ref 8.9–10.3)
Chloride: 105 mmol/L (ref 98–111)
Creatinine, Ser: 0.62 mg/dL (ref 0.44–1.00)
GFR, Estimated: >60 mL/min (ref >60)
Glucose, Bld: 79 mg/dL (ref 70–99)
Potassium: 4 mmol/L (ref 3.5–5.1)
Sodium: 140 mmol/L (ref 135–145)
Total Bilirubin: 0.7 mg/dL (ref 0.3–1.2)
Total Protein: 6.7 g/dL (ref 6.5–8.1)

## 2021-07-09 LAB — CBC WITH DIFFERENTIAL/PLATELET
Abs Immature Granulocytes: 0.04 10*3/uL (ref 0.00–0.07)
Basophils Absolute: 0.1 10*3/uL (ref 0.0–0.1)
Basophils Relative: 1 %
Eosinophils Absolute: 0.1 10*3/uL (ref 0.0–0.5)
Eosinophils Relative: 2 %
HCT: 43.7 % (ref 36.0–46.0)
Hemoglobin: 14.8 g/dL (ref 12.0–15.0)
Immature Granulocytes: 0 %
Lymphocytes Relative: 14 %
Lymphs Abs: 1.3 10*3/uL (ref 0.7–4.0)
MCH: 34.6 pg — ABNORMAL HIGH (ref 26.0–34.0)
MCHC: 33.9 g/dL (ref 30.0–36.0)
MCV: 102.1 fL — ABNORMAL HIGH (ref 80.0–100.0)
Monocytes Absolute: 0.3 10*3/uL (ref 0.1–1.0)
Monocytes Relative: 3 %
Neutro Abs: 7.4 10*3/uL (ref 1.7–7.7)
Neutrophils Relative %: 80 %
Platelets: 593 10*3/uL — ABNORMAL HIGH (ref 150–400)
RBC: 4.28 MIL/uL (ref 3.87–5.11)
RDW: 13.9 % (ref 11.5–15.5)
WBC: 9.4 10*3/uL (ref 4.0–10.5)
nRBC: 0 % (ref 0.0–0.2)

## 2021-07-09 NOTE — Progress Notes (Signed)
[place] CONSULT NOTE  Patient Care Team: Pcp, No as PCP - General  CHIEF COMPLAINTS/PURPOSE OF CONSULTATION:  Thrombocytosis.  ASSESSMENT & PLAN:  No problem-specific Assessment & Plan notes found for this encounter.  Orders Placed This Encounter  Procedures   CBC with Differential/Platelet    Standing Status:   Standing    Number of Occurrences:   22    Standing Expiration Date:   07/09/2022   JAK2 V617F, w Reflex to CALR/E12/MPL   Erythropoietin    Standing Status:   Future    Number of Occurrences:   1    Standing Expiration Date:   07/09/2022   Comprehensive metabolic panel    Standing Status:   Standing    Number of Occurrences:   33    Standing Expiration Date:   07/09/2022   This is a very pleasant 55 year old female patient with past medical history significant for mitral valve prolapse referred to hematology for evaluation of persistent thrombocytosis.  Patient denies any new health complaints except for a recent episode of vertigo.  She follows up with OB/GYN once a year, has not established with primary care physician except for recently.  Physical examination unremarkable, no palpable splenomegaly.  I have reviewed her labs for the past several years which certainly show leukocytosis, polycythemia and thrombocytosis.  Given multiple cell lines involved, my primary differential diagnosis is possible myeloproliferative disorder.  Differential diagnosis  1. Primary thrombocytosis: Related to myeloproliferative disorders of the bone marrow especially essential thrombocytosis and CML. I would like to send for BCR-ABL as well as JAK-2 mutation analysis. Patient understands that JAK2 mutation is only present in 50% of essential thrombocytosis so the test is advantageous only if it is positive. If it is negative, it does not rule out. 2. Secondary/reactive thrombocytosis Different causes including infections, inflammation, iron deficiency.  And less suspicious of  secondary causes of thrombocytosis for this patient.  Treatment options: We will discuss treatment options based on the etiology.  Depending on her risk stratification she may need aspirin as well as cytoreductive agents if essential thrombocytosis.  To treat secondary thrombocytosis she will mostly need correction of the underlying etiology. She will return to clinic in a few weeks to review labs and to discuss additional recommendations.  In the interim, have suggested that she try aspirin 81 mg daily if there is no other contraindication.  I have reviewed her prior medical records from 2016 when she saw Dr. Leonie Man from [facility] and according to his note, there is no evidence of intracranial aneurysm, AVM or vascular abnormality on angiogram.  Thank you for consulting [place] in the care of this patient.  Please do not hesitate to contact us with any additional questions or concerns.  HISTORY OF PRESENTING ILLNESS:  Regina [name] 55 y.o. female is here because of thrombocytosis.  This is a very pleasant 55 year old female patient with past medical history significant for mitral valve prolapse who recently had an episode of vertigo and hands went to the ER where she was investigated and found to have thrombocytosis and referred to hematology for further evaluation and recommendations.  She arrived to the appointment today with her daughter.  She feels quite well except for the recent episode of vertigo and stroke about 7 years ago.  She denies any other history of thromboembolic episodes.  No erythromelalgia but she definitely reports some pruritus for a while, not specifically aqua genic pruritus. No family history of hematological disorders.  No known iron deficiency.  Currently she only reports occasional episodes of vertigo and some seasonal allergies. Rest of the pertinent 10 point ROS reviewed and negative.  REVIEW OF SYSTEMS:   Constitutional: Denies fevers, chills or  abnormal night sweats Eyes: Denies blurriness of vision, double vision or watery eyes Ears, nose, mouth, throat, and face: Denies mucositis or sore throat Respiratory: Denies cough, dyspnea or wheezes Cardiovascular: Denies palpitation, chest discomfort or lower extremity swelling Gastrointestinal:  Denies nausea, heartburn or change in bowel habits Skin: Denies abnormal skin rashes Lymphatics: Denies new lymphadenopathy or easy bruising Neurological:Denies numbness, tingling or new weaknesses Behavioral/Psych: Mood is stable, no new changes  All other systems were reviewed with the patient and are negative.  MEDICAL HISTORY:  Past Medical History:  Diagnosis Date   Allergies    Endometriosis    Mitral valve prolapse    Stroke ([place]) 03/22/2014    SURGICAL HISTORY: Past Surgical History:  Procedure Laterality Date   LAPAROSCOPIC APPENDECTOMY N/A 04/19/2019   Procedure: APPENDECTOMY LAPAROSCOPIC;  Surgeon: Aviva Signs, MD;  Location: [facility];  Service: General;  Laterality: N/A;   ROOT CANAL     TEE WITHOUT CARDIOVERSION N/A 03/26/2014   Procedure: TRANSESOPHAGEAL ECHOCARDIOGRAM (TEE);  Surgeon: Larey Dresser, MD;  Location: [facility] ENDOSCOPY;  Service: Cardiovascular;  Laterality: N/A;   WISDOM TOOTH EXTRACTION      SOCIAL HISTORY: Social History   Socioeconomic History   Marital status: Married    Spouse name: Ricky   Number of children: 4   Years of education: college   Highest education level: Not on file  Occupational History   Occupation: other  Tobacco Use   Smoking status: Never   Smokeless tobacco: Never  Vaping Use   Vaping Use: Never used  Substance and Sexual Activity   Alcohol use: Yes    Comment: rarely per pt   Drug use: No   Sexual activity: Not Currently  Other Topics Concern   Not on file  Social History Narrative   Not on file   Social Determinants of Health   Financial Resource Strain: Low Risk    Difficulty of Paying Living Expenses: Not very  hard  Food Insecurity: No Food Insecurity   Worried About Charity fundraiser in the Last Year: Never true   Ran Out of Food in the Last Year: Never true  Transportation Needs: No Transportation Needs   Lack of Transportation (Medical): No   Lack of Transportation (Non-Medical): No  Physical Activity: Inactive   Days of Exercise per Week: 0 days   Minutes of Exercise per Session: 0 min  Stress: No Stress Concern Present   Feeling of Stress : Not at all  Social Connections: Moderately Isolated   Frequency of Communication with Friends and Family: More than three times a week   Frequency of Social Gatherings with Friends and Family: More than three times a week   Attends Religious Services: More than 4 times per year   Active Member of [employer] or Organizations: No   Attends Archivist Meetings: Never   Marital Status: Divorced  Human resources officer Violence: Not At Risk   Fear of Current or Ex-Partner: No   Emotionally Abused: No   Physically Abused: No   Sexually Abused: No    FAMILY HISTORY: Family History  Problem Relation Age of Onset   Hyperlipidemia Mother    Hypertension Father    Cancer Father        kidney  Asthma Daughter        exercise induced   Heart murmur Son    Migraines Son    Diabetes Maternal Grandmother    Heart disease Maternal Grandmother    Colon cancer Maternal Grandmother    Heart attack Maternal Grandfather    Heart disease Maternal Grandfather    Esophageal cancer Neg Hx    Stomach cancer Neg Hx    Rectal cancer Neg Hx     ALLERGIES:  has No Known Allergies.  MEDICATIONS:  Current Outpatient Medications  Medication Sig Dispense Refill   acetaminophen (TYLENOL) 325 MG tablet Take 650 mg by mouth as needed for headache.      Calcium Citrate-Vitamin D (CALCIUM + D PO) Take 1 tablet by mouth daily.     estradiol (ESTRACE) 0.1 MG/GM vaginal cream Place 1 Applicatorful vaginally once a week.     ibuprofen (ADVIL,MOTRIN) 200 MG tablet Take  200 mg by mouth as needed for headache.      Magnesium 250 MG TABS Take by mouth daily.     Multiple Vitamins-Minerals (MULTI + OMEGA-3 ADULT GUMMIES PO) Take 2 Units by mouth daily.     ondansetron (ZOFRAN ODT) 4 MG disintegrating tablet '4mg'$  ODT q4 hours prn nausea/vomit 10 tablet 0   OVER THE COUNTER MEDICATION as directed. "Flash Away" for hot flashes.     PREMARIN vaginal cream Place 1 application vaginally once a week.     diphenhydrAMINE (BENADRYL) 25 MG tablet Take 25 mg by mouth every 6 (six) hours as needed for allergies or sleep.  (Patient not taking: Reported on 07/09/2021)     HYDROcodone-acetaminophen (NORCO) 5-325 MG tablet Take 1 tablet by mouth every 4 (four) hours as needed for moderate pain. (Patient not taking: Reported on 07/09/2021) 40 tablet 0   meclizine (ANTIVERT) 25 MG tablet Take 1 tablet (25 mg total) by mouth 3 (three) times daily as needed for dizziness. (Patient not taking: Reported on 07/09/2021) 10 tablet 0   ondansetron (ZOFRAN) 4 MG tablet Take 1 tablet (4 mg total) by mouth every 8 (eight) hours as needed for nausea or vomiting. (Patient not taking: Reported on 07/09/2021) 20 tablet 1   tiZANidine (ZANAFLEX) 2 MG tablet Take 1 tablet (2 mg total) by mouth every 8 (eight) hours as needed. (Patient not taking: Reported on 07/09/2021) 30 tablet 0   Current Facility-Administered Medications  Medication Dose Route Frequency Provider Last Rate Last Admin   0.9 %  sodium chloride infusion  500 mL Intravenous Once [place] Park, MD       0.9 %  sodium chloride infusion  500 mL Intravenous Once [place] Park, MD       ondansetron (ZOFRAN) 4 mg in sodium chloride 0.9 % 50 mL IVPB  4 mg Intravenous Once Thornton Park, MD         PHYSICAL EXAMINATION:  ECOG PERFORMANCE STATUS: 0 - Asymptomatic  Vitals:   07/09/21 1000  BP: 123/68  Pulse: 68  Resp: 16  Temp: 98.3 F (36.8 C)  SpO2: 97%   Filed Weights   07/09/21 1000  Weight: 132 lb 12.8 oz (60.2 kg)     GENERAL:alert, no distress and comfortable SKIN: skin color, texture, turgor are normal, no rashes or significant lesions EYES: normal, conjunctiva are pink and non-injected, sclera clear OROPHARYNX:no exudate, no erythema and lips, buccal mucosa, and tongue normal  NECK: supple, thyroid normal size, non-tender, without nodularity LYMPH:  no palpable lymphadenopathy in the cervical, axillary or inguinal LUNGS:  clear to auscultation and percussion with normal breathing effort HEART: regular rate & rhythm and no murmurs and no lower extremity edema ABDOMEN:abdomen soft, non-tender and normal bowel sounds Musculoskeletal:no cyanosis of digits and no clubbing  PSYCH: alert & oriented x 3 with fluent speech NEURO: no focal motor/sensory deficits  LABORATORY DATA:  I have reviewed the data as listed Lab Results  Component Value Date   WBC 9.4 07/09/2021   HGB 14.8 07/09/2021   HCT 43.7 07/09/2021   MCV 102.1 (H) 07/09/2021   PLT 593 (H) 07/09/2021     Chemistry      Component Value Date/Time   NA 140 07/09/2021 1101   K 4.0 07/09/2021 1101   CL 105 07/09/2021 1101   CO2 29 07/09/2021 1101   BUN 16 07/09/2021 1101   CREATININE 0.62 07/09/2021 1101   GLU 113 08/13/2016 0000      Component Value Date/Time   CALCIUM 9.0 07/09/2021 1101   ALKPHOS 68 07/09/2021 1101   AST 15 07/09/2021 1101   ALT 14 07/09/2021 1101   BILITOT 0.7 07/09/2021 1101     I have reviewed her labs for the past several years.  She has had chronic thrombocytosis, leukocytosis and polycythemia.  RADIOGRAPHIC STUDIES: I have personally reviewed the radiological images as listed and agreed with the findings in the report. No results found.  All questions were answered. The patient knows to call the clinic with any problems, questions or concerns. I spent 45 minutes in the care of this patient including H and P, review of records, counseling and coordination of care.     Benay Pike, MD 07/09/2021  2:30 PM

## 2021-07-12 LAB — ERYTHROPOIETIN: Erythropoietin: 1.9 m[IU]/mL — ABNORMAL LOW (ref 2.6–18.5)

## 2021-07-14 LAB — JAK2 V617F, W REFLEX TO CALR/E12/MPL

## 2021-07-29 NOTE — Progress Notes (Signed)
[place] CONSULT NOTE  Patient Care Team: Pcp, No as PCP - General  CHIEF COMPLAINTS/PURPOSE OF CONSULTATION:  Thrombocytosis.  ASSESSMENT & PLAN:   High risk, Essential thrombocytosis  This is a very pleasant 55 year old female patient with past medical history significant for mitral valve prolapse referred to hematology for evaluation of persistent thrombocytosis.  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   She currently continues on baby aspirin 81 mg p.o. daily.  No prior history of DVT/PE. Given presence of JAK 2 mutation and history of stroke, I believe it is reasonable to initiate cytoreductive agent with Hydrea along with baby aspirin daily. I have discussed about mechanism of action of Hydrea, adverse effects from Hydrea including but not limited to fatigue, skin rash, mouth ulcers, leg swelling, cytopenias and small increased risk of malignancy. I have discussed that the main role for Hydrea in addition to aspirin is to reduce the risk of thromboembolic events. We have discussed essential thrombocytosis is a premalignant condition and needs to be monitored on a periodic basis.  Printed information about hydroxyurea was given to the patient. She will return to clinic in 2 weeks to review her decision about initiating Hydrea.  In the interim she will continue baby aspirin daily.  I have reviewed her prior medical records from 2016 when she saw Dr. Leonie Man from [facility] and according to his note, there is no evidence of intracranial aneurysm, AVM or vascular abnormality on angiogram.  Thank you for consulting [place] in the care of this patient.  Please do not hesitate to contact us with any additional questions or concerns.  HISTORY OF PRESENTING ILLNESS:  Regina [name] 55 y.o. female is here because of thrombocytosis.  This is a very pleasant 55 year old female patient with past medical history significant for mitral valve  prolapse who recently had an episode of vertigo and hands went to the ER where she was investigated and found to have thrombocytosis and referred to hematology for further evaluation and recommendations.    Interval History  Regina [name] is here for follow-up.  She has been doing quite well.  No complaints. Rest of the pertinent 10 point ROS reviewed and pertinent for some shortness of breath with exertion, anxiety situational, dizziness.  She notices some intermittent chest pain at night. Rest of the pertinent 10 point ROS reviewed and negative.  REVIEW OF SYSTEMS:   Constitutional: Denies fevers, chills or abnormal night sweats Eyes: Denies blurriness of vision, double vision or watery eyes Ears, nose, mouth, throat, and face: Denies mucositis or sore throat Respiratory: Denies cough, dyspnea or wheezes Cardiovascular: Denies palpitation, chest discomfort or lower extremity swelling Gastrointestinal:  Denies nausea, heartburn or change in bowel habits Skin: Denies abnormal skin rashes Lymphatics: Denies new lymphadenopathy or easy bruising Neurological:Denies numbness, tingling or new weaknesses Behavioral/Psych: Mood is stable, no new changes  All other systems were reviewed with the patient and are negative.  MEDICAL HISTORY:  Past Medical History:  Diagnosis Date   Allergies    Endometriosis    Mitral valve prolapse    Stroke ([place]) 03/22/2014    SURGICAL HISTORY: Past Surgical History:  Procedure Laterality Date   LAPAROSCOPIC APPENDECTOMY N/A 04/19/2019   Procedure: APPENDECTOMY LAPAROSCOPIC;  Surgeon: Aviva Signs, MD;  Location: [facility];  Service: General;  Laterality: N/A;   ROOT CANAL     TEE WITHOUT CARDIOVERSION N/A 03/26/2014   Procedure: TRANSESOPHAGEAL ECHOCARDIOGRAM (TEE);  Surgeon: Larey Dresser, MD;  [facility]  ENDOSCOPY;  Service: Cardiovascular;  Laterality: N/A;   WISDOM TOOTH EXTRACTION      SOCIAL HISTORY: Social History   Socioeconomic History    Marital status: Married    Spouse name: Ricky   Number of children: 4   Years of education: college   Highest education level: Not on file  Occupational History   Occupation: other  Tobacco Use   Smoking status: Never   Smokeless tobacco: Never  Vaping Use   Vaping Use: Never used  Substance and Sexual Activity   Alcohol use: Yes    Comment: rarely per pt   Drug use: No   Sexual activity: Not Currently  Other Topics Concern   Not on file  Social History Narrative   Not on file   Social Determinants of Health   Financial Resource Strain: Low Risk    Difficulty of Paying Living Expenses: Not very hard  Food Insecurity: No Food Insecurity   Worried About Charity fundraiser in the Last Year: Never true   Ran Out of Food in the Last Year: Never true  Transportation Needs: No Transportation Needs   Lack of Transportation (Medical): No   Lack of Transportation (Non-Medical): No  Physical Activity: Inactive   Days of Exercise per Week: 0 days   Minutes of Exercise per Session: 0 min  Stress: No Stress Concern Present   Feeling of Stress : Not at all  Social Connections: Moderately Isolated   Frequency of Communication with Friends and Family: More than three times a week   Frequency of Social Gatherings with Friends and Family: More than three times a week   Attends Religious Services: More than 4 times per year   Active Member of [employer] or Organizations: No   Attends Music therapist: Never   Marital Status: Divorced  Human resources officer Violence: Not At Risk   Fear of Current or Ex-Partner: No   Emotionally Abused: No   Physically Abused: No   Sexually Abused: No    FAMILY HISTORY: Family History  Problem Relation Age of Onset   Hyperlipidemia Mother    Hypertension Father    Cancer Father        kidney   Asthma Daughter        exercise induced   Heart murmur Son    Migraines Son    Diabetes Maternal Grandmother    Heart disease Maternal Grandmother     Colon cancer Maternal Grandmother    Heart attack Maternal Grandfather    Heart disease Maternal Grandfather    Esophageal cancer Neg Hx    Stomach cancer Neg Hx    Rectal cancer Neg Hx     ALLERGIES:  has No Known Allergies.  MEDICATIONS:  Current Outpatient Medications  Medication Sig Dispense Refill   acetaminophen (TYLENOL) 325 MG tablet Take 650 mg by mouth as needed for headache.      estradiol (ESTRACE) 0.1 MG/GM vaginal cream Place 1 Applicatorful vaginally once a week.     tiZANidine (ZANAFLEX) 2 MG tablet Take 1 tablet (2 mg total) by mouth every 8 (eight) hours as needed. 30 tablet 0   Calcium Citrate-Vitamin D (CALCIUM + D PO) Take 1 tablet by mouth daily. (Patient not taking: Reported on 07/30/2021)     diphenhydrAMINE (BENADRYL) 25 MG tablet Take 25 mg by mouth every 6 (six) hours as needed for allergies or sleep.  (Patient not taking: No sig reported)     ibuprofen (ADVIL,MOTRIN) 200 MG tablet  Take 200 mg by mouth as needed for headache.  (Patient not taking: Reported on 07/30/2021)     Magnesium 250 MG TABS Take by mouth daily. (Patient not taking: Reported on 07/30/2021)     meclizine (ANTIVERT) 25 MG tablet Take 1 tablet (25 mg total) by mouth 3 (three) times daily as needed for dizziness. (Patient not taking: No sig reported) 10 tablet 0   Multiple Vitamins-Minerals (MULTI + OMEGA-3 ADULT GUMMIES PO) Take 2 Units by mouth daily. (Patient not taking: Reported on 07/30/2021)     ondansetron (ZOFRAN ODT) 4 MG disintegrating tablet '4mg'$  ODT q4 hours prn nausea/vomit (Patient not taking: Reported on 07/30/2021) 10 tablet 0   ondansetron (ZOFRAN) 4 MG tablet Take 1 tablet (4 mg total) by mouth every 8 (eight) hours as needed for nausea or vomiting. (Patient not taking: No sig reported) 20 tablet 1   OVER THE COUNTER MEDICATION as directed. "Flash Away" for hot flashes. (Patient not taking: Reported on 07/30/2021)     Current Facility-Administered Medications  Medication Dose Route  Frequency Provider Last Rate Last Admin   0.9 %  sodium chloride infusion  500 mL Intravenous Once Thornton Park, MD       0.9 %  sodium chloride infusion  500 mL Intravenous Once [place] Park, MD       ondansetron (ZOFRAN) 4 mg in sodium chloride 0.9 % 50 mL IVPB  4 mg Intravenous Once Thornton Park, MD       PHYSICAL EXAMINATION:  ECOG PERFORMANCE STATUS: 0 - Asymptomatic  Vitals:   07/30/21 1146  BP: (!) 101/57  Pulse: 72  Resp: 18  Temp: 98.5 F (36.9 C)  SpO2: 97%   Physical exam deferred today in lieu of counseling  LABORATORY DATA:  I have reviewed the data as listed Lab Results  Component Value Date   WBC 9.4 07/09/2021   HGB 14.8 07/09/2021   HCT 43.7 07/09/2021   MCV 102.1 (H) 07/09/2021   PLT 593 (H) 07/09/2021     Chemistry      Component Value Date/Time   NA 140 07/09/2021 1101   K 4.0 07/09/2021 1101   CL 105 07/09/2021 1101   CO2 29 07/09/2021 1101   BUN 16 07/09/2021 1101   CREATININE 0.62 07/09/2021 1101   GLU 113 08/13/2016 0000      Component Value Date/Time   CALCIUM 9.0 07/09/2021 1101   ALKPHOS 68 07/09/2021 1101   AST 15 07/09/2021 1101   ALT 14 07/09/2021 1101   BILITOT 0.7 07/09/2021 1101     I have reviewed her labs.  CMP normal.  EPO 1.9.  JAK2 GenotypR Comment Abnormal    Comment: (NOTE)  Result:  POSITIVE for the detection of the V617F mutation.  Interpretation:  The assay detected the presence of a G to T  nucleotide change encoding the V617F mutation within JAK2.  Interpretation of this result should be made in the context of  other clinical, morphologic, and cytogenetic findings.     RADIOGRAPHIC STUDIES: I have personally reviewed the radiological images as listed and agreed with the findings in the report. No results found.  All questions were answered. The patient knows to call the clinic with any problems, questions or concerns. I spent 30 minutes in the care of this patient including review of labs,  history, discussion about ET, role of Hydrea, adverse effects of Hydrea and role of aspirin.    Benay Pike, MD 07/30/2021 2:21 PM

## 2021-07-30 ENCOUNTER — Other Ambulatory Visit: Payer: Self-pay

## 2021-07-30 ENCOUNTER — Encounter (HOSPITAL_COMMUNITY): Payer: Self-pay | Admitting: Hematology and Oncology

## 2021-07-30 ENCOUNTER — Inpatient Hospital Stay (HOSPITAL_COMMUNITY)
Payer: No Typology Code available for payment source | Attending: Hematology and Oncology | Admitting: Hematology and Oncology

## 2021-07-30 VITALS — BP 101/57 | HR 72 | Temp 98.5°F | Resp 18 | Wt 132.8 lb

## 2021-07-30 DIAGNOSIS — Z79899 Other long term (current) drug therapy: Secondary | ICD-10-CM | POA: Diagnosis not present

## 2021-07-30 DIAGNOSIS — Z7982 Long term (current) use of aspirin: Secondary | ICD-10-CM | POA: Diagnosis not present

## 2021-07-30 DIAGNOSIS — D473 Essential (hemorrhagic) thrombocythemia: Secondary | ICD-10-CM

## 2021-07-30 DIAGNOSIS — D75839 Thrombocytosis, unspecified: Secondary | ICD-10-CM | POA: Diagnosis not present

## 2021-08-11 ENCOUNTER — Ambulatory Visit: Payer: No Typology Code available for payment source | Admitting: Neurology

## 2021-08-11 ENCOUNTER — Encounter: Payer: Self-pay | Admitting: Neurology

## 2021-08-11 VITALS — BP 154/82 | HR 84 | Ht 64.0 in | Wt 134.0 lb

## 2021-08-11 DIAGNOSIS — R42 Dizziness and giddiness: Secondary | ICD-10-CM

## 2021-08-11 DIAGNOSIS — Z8679 Personal history of other diseases of the circulatory system: Secondary | ICD-10-CM

## 2021-08-11 NOTE — Progress Notes (Signed)
[facility] [address] [place]. [place] [address] [phone]       OFFICE CONSULT NOTE  Regina Regina [name] Date of Birth:  09-13-1966 Medical Record Number:  [id number]   Referring MD: Delphina Cahill  Reason for Referral: Vertigo  HPI: Regina Regina [name] is a 55 year old Caucasian lady seen today for consultation for vertigo episode.  Regina Regina [name] is accompanied by her daughter.  History is obtained from them and review of electronic medical records and personally reviewed pertinent available imaging films in PACS.  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  Regina Regina [name] developed sudden onset of vertigo on 03/20/2021 and presented to [facility] emergency room.  Regina Regina [name] felt nauseous and eventually threw up.  Regina vertigo is not positional and lasted for several hours in [place] ER Regina Regina [name] was treated with IV hydration and Reglan and gradually improved but Regina vertigo did not fully go away till about a week or so later.  Regina Regina [name] had CT scan of Regina head which showed no acute abnormality and CT angiogram which showed no significant stenosis of large intracranial or extracranial vessels.  Regina [name]'s lab work suggested mildly elevated white count and hematocrit but platelet counts with significantly elevated at 593,000.  Regina Regina [name] subsequently has had lab work for positive JAK2 mutation and seen hematologist Dr. Chryl Heck who has diagnosed her with essential thrombocytosis and recommended treatment with hydroxyurea.  Regina [name] states that Regina Regina [name] has not had any further recurrence of her vertigo or dizziness and Regina Regina [name] has no neurological complaints.  Regina Regina [name] does admit to feeling fatigued and tired easily however.  Regina Regina [name] on inquiry admits to mild bilateral tinnitus as well as possibly decreased hearing though Regina Regina [name] has not had any formal ENT eval.  Regina Regina [name] denies any prior history of  benign paroxysmal vertigo.  There was no focal neurological symptoms accompanying her vertigo episode.  Regina Regina [name] denies history of recurrent ear infections, head injury or ear trauma.  Review of electronic medical record show that Regina Regina [name] was admitted on 03/23/2014 with right frontal parenchymal hematoma with some adjacent subarachnoid hemorrhage.  CT angiogram of Regina brain and neck were negative.  Four-vessel diagnostic cerebral catheter angiogram on 03/25/2014 showed no evidence of AVM, aneurysm or vascular abnormality.  A follow-up study was suggested and was done on 53/25/2016 which showed no evidence of AV fistula, dissection, stenosis or occlusion.  Regina [name]'s lab work at that time in 2015 also showed mildly elevated white count, platelets and hematocrit.  ROS:   14 system review of systems is positive for dizziness, vertigo, nausea, vomiting, tinnitus and all other systems negative  PMH:  Past Medical History:  Diagnosis Date   Allergies    Endometriosis    Essential thrombocytosis ([place])    Mitral valve prolapse    Stroke ([place]) 03/22/2014    Social History:  Social History   Socioeconomic History   Marital status: Married    Spouse name: Ricky   Number of children: 4   Years of education: college   Highest education level: Not on file  Occupational History   Occupation: other  Tobacco Use   Smoking status: Never   Smokeless tobacco: Never  Vaping Use   Vaping Use: Never used  Substance and Sexual Activity   Alcohol use: Yes    Comment: rarely per pt   Drug use: No   Sexual activity: Not Currently  Other Topics  Concern   Not on file  Social History Narrative   Lives with daughter   Right Handed   Drinks 1-2 cups caffeine daily   Social Determinants of Health   Financial Resource Strain: Low Risk    Difficulty of Paying Living Expenses: Not very hard  Food Insecurity: No Food Insecurity   Worried About Charity fundraiser in [place] Last Year: Never true   Ran Out of Food in [place]  Last Year: Never true  Transportation Needs: No Transportation Needs   Lack of Transportation (Medical): No   Lack of Transportation (Non-Medical): No  Physical Activity: Inactive   Days of Exercise per Week: 0 days   Minutes of Exercise per Session: 0 min  Stress: No Stress Concern Present   Feeling of Stress : Not at all  Social Connections: Moderately Isolated   Frequency of Communication with Friends and Family: More than three times a week   Frequency of Social Gatherings with Friends and Family: More than three times a week   Attends Religious Services: More than 4 times per year   Active Member of [employer] or Organizations: No   Attends Archivist Meetings: Never   Marital Status: Divorced  Human resources officer Violence: Not At Risk   Fear of Current or Ex-Partner: No   Emotionally Abused: No   Physically Abused: No   Sexually Abused: No    Medications:   Current Outpatient Medications on File Prior to Visit  Medication Sig Dispense Refill   acetaminophen (TYLENOL) 325 MG tablet Take 650 mg by mouth as needed for headache.      aspirin EC 81 MG tablet Take 81 mg by mouth daily. Swallow whole.     estradiol (ESTRACE) 0.1 MG/GM vaginal cream Place 1 Applicatorful vaginally once a week.     tiZANidine (ZANAFLEX) 2 MG tablet Take 1 tablet (2 mg total) by mouth every 8 (eight) hours as needed. 30 tablet 0   Current Facility-Administered Medications on File Prior to Visit  Medication Dose Route Frequency Provider Last Rate Last Admin   0.9 %  sodium chloride infusion  500 mL Intravenous Once [place] Park, MD       0.9 %  sodium chloride infusion  500 mL Intravenous Once [place] Park, MD       ondansetron [facility]) 4 mg in sodium chloride 0.9 % 50 mL IVPB  4 mg Intravenous Once Thornton Park, MD        Allergies:  No Known Allergies  Physical Exam General: well developed, well nourished middle-aged Caucasian lady, seated, in no evident distress Head: head  normocephalic and atraumatic.   Neck: supple with no carotid or supraclavicular bruits Cardiovascular: regular rate and rhythm, no murmurs Musculoskeletal: no deformity Skin:  no rash/petichiae Vascular:  Normal pulses all extremities  Neurologic Exam Mental Status: Awake and fully alert. Oriented to place and time. Recent and remote memory intact. Attention span, concentration and fund of knowledge appropriate. Mood and affect appropriate.  Cranial Nerves: Fundoscopic exam reveals sharp disc margins. Pupils equal, briskly reactive to light. Extraocular movements full without nystagmus. Visual fields full to confrontation. Hearing intact. Facial sensation intact. Face, tongue, palate moves normally and symmetrically.  Motor: Normal bulk and tone. Normal strength in all tested extremity muscles. Sensory.: intact to touch , pinprick , position and vibratory sensation.  Coordination: Rapid alternating movements normal in all extremities. Finger-to-nose and heel-to-shin performed accurately bilaterally.  Fukuda stepping test is positive with Regina [name] moving off base and  rotating to Regina left. Gait and Station: Arises from chair without difficulty. Stance is normal. Gait demonstrates normal stride length and balance . Able to heel, toe and tandem walk without difficulty.  Reflexes: 1+ and symmetric. Toes downgoing.       ASSESSMENT: 54 year old Caucasian lady with episode of vertigo and dizziness in April 2022 of unclear etiology.  Possibilities include peripheral vestibular dysfunction versus small brainstem event.  Remote history of right frontal intracerebral hemorrhage of indeterminate etiology in May 2015 possibly related to mild myeloproliferative disorder from which Regina Regina [name] has done well with no lasting deficits.  Recent diagnosis of essential thrombocytosis with positive JAK2 mutation not sure if this has any relationship to her vertigo symptoms but possibly could have contributed to her  intracerebral hemorrhage in 2015     PLAN: I had a long discussion with Regina Regina [name] and her daughter regarding her recent history of episode of vertigo and discussed differential diagnosis and evaluation treatment plan and answered questions.  Recommend checking MRI scan of Regina brain with and without contrast with thin sections of intramedullary canals to look for any structural lesions in Regina middle ear.  Regina Regina [name] will continue aspirin 81 mg daily for stroke prevention.  He was advised to continue follow-up with hematologist Dr. Chryl Heck for treatment of essential thrombocytosis.  Regina Regina [name] will return for follow-up in [place] future with treatment accordingly if necessary.  Greater than 50% time during this 45-minute consultation visit was spent on counseling and coordination of care about her vertigo and discussion about remote intracerebral hemorrhage and answering questions. Antony Contras, MD Note: This document was prepared with digital dictation and possible smart phrase technology. Any transcriptional errors that result from this process are unintentional.

## 2021-08-11 NOTE — Patient Instructions (Signed)
I had a long discussion with the patient regarding her recent history of episode of vertigo and discussed differential diagnosis and evaluation treatment plan and answered questions.  Recommend checking MRI scan of the brain with and without contrast with thin sections of intramedullary canals to look for any structural lesions in the middle ear.  She will continue aspirin for stroke prevention.  He was advised to continue follow-up with hematologist Dr. Chryl Heck for treatment of essential thrombocytosis.  She will return for follow-up in the future with treatment accordingly if necessary. Vertigo Vertigo is the feeling that you or the things around you are moving when they are not. This feeling can come and go at any time. Vertigo often goes away on its own. This condition can be dangerous if it happens when you are doing activities like driving or working with machines. Your doctor will do tests to find the cause of your vertigo. These tests will also help your doctor decide on the best treatment for you. Follow these instructions at home: Eating and drinking   Drink enough fluid to keep your pee (urine) pale yellow. Do not drink alcohol. Activity Return to your normal activities when your doctor says that it is safe. In the morning, first sit up on the side of the bed. When you feel okay, stand slowly while you hold onto something until you know that your balance is fine. Move slowly. Avoid sudden body or head movements or certain positions, as told by your doctor. Use a cane if you have trouble standing or walking. Sit down right away if you feel dizzy. Avoid doing any tasks or activities that can cause danger to you or others if you get dizzy. Avoid bending down if you feel dizzy. Place items in your home so that they are easy for you to reach without bending or leaning over. Do not drive or use machinery if you feel dizzy. General instructions Take over-the-counter and prescription medicines  only as told by your doctor. Keep all follow-up visits. Contact a doctor if: Your medicine does not help your vertigo. Your problems get worse or you have new symptoms. You have a fever. You feel like you may vomit (nauseous), or this feeling gets worse. You start to vomit. Your family or friends see changes in how you act. You lose feeling (have numbness) in part of your body. You feel prickling and tingling in a part of your body. Get help right away if: You are always dizzy. You faint. You get very bad headaches. You get a stiff neck. Bright light starts to bother you. You have trouble moving or talking. You feel weak in your hands, arms, or legs. You have changes in your hearing or in how you see (vision). These symptoms may be an emergency. Get help right away. Call your local emergency services (911 in the [place] Do not wait to see if the symptoms will go away. Do not drive yourself to the hospital. Summary Vertigo is the feeling that you or the things around you are moving when they are not. Your doctor will do tests to find the cause of your vertigo. You may be told to avoid some tasks, positions, or movements. Contact a doctor if your medicine is not helping, or if you have a fever, new symptoms, or a change in how you act. Get help right away if you get very bad headaches, or if you have changes in how you speak, hear, or see. This information is not  intended to replace advice given to you by your health care provider. Make sure you discuss any questions you have with your health care provider. Document Revised: 10/07/2020 Document Reviewed: 10/07/2020 Elsevier Patient Education  2022 [employer].

## 2021-08-20 ENCOUNTER — Ambulatory Visit (HOSPITAL_COMMUNITY): Payer: No Typology Code available for payment source | Admitting: Hematology and Oncology

## 2021-08-23 ENCOUNTER — Telehealth: Payer: Self-pay | Admitting: Neurology

## 2021-08-23 NOTE — Telephone Encounter (Signed)
Regina [name] through [place] [facility] pending faxed notes.   [facility] ph # [phone] fax # [phone].

## 2021-08-26 NOTE — Telephone Encounter (Signed)
MR Brain w/wo contrast Dr. Ronnell [place]ara Josem Kaufmann: [id number] (exp. 08/23/21 to 11/23/21). Patient is scheduled at [facility] for 09/01/21.

## 2021-09-01 ENCOUNTER — Ambulatory Visit: Payer: No Typology Code available for payment source

## 2021-09-01 DIAGNOSIS — R42 Dizziness and giddiness: Secondary | ICD-10-CM | POA: Diagnosis not present

## 2021-09-01 MED ORDER — GADOBENATE DIMEGLUMINE 529 MG/ML IV SOLN
12.0000 mL | Freq: Once | INTRAVENOUS | Status: AC | PRN
  Administered 2021-09-01: 12 mL via INTRAVENOUS

## 2021-09-07 ENCOUNTER — Telehealth: Payer: Self-pay | Admitting: *Deleted

## 2021-09-07 NOTE — Telephone Encounter (Signed)
I spoke to the patient and she verbalized understanding of the findings.

## 2021-09-07 NOTE — Telephone Encounter (Signed)
-----   Message from Garvin Fila, MD sent at 09/07/2021  1:01 PM EDT ----- Regina [name] inform the patient that MRI scan of the brain shows remote age hemorrhage on the right side which expected changes compared to previous MRI from 2015.  There is a tiny blood vessels in the back portion of the brain which is a birth variant and nothing to worry about ----- Message ----- From: Britt Bottom, MD Sent: 09/03/2021  10:56 AM EDT To: Garvin Fila, MD

## 2024-09-21 ENCOUNTER — Emergency Department (HOSPITAL_COMMUNITY)
Admission: EM | Admit: 2024-09-21 | Discharge: 2024-09-21 | Disposition: A | Discharge status: Home / Self Care | Attending: Emergency Medicine | Admitting: Emergency Medicine

## 2024-09-21 ENCOUNTER — Encounter (HOSPITAL_COMMUNITY): Payer: Self-pay | Admitting: Emergency Medicine

## 2024-09-21 ENCOUNTER — Other Ambulatory Visit: Payer: Self-pay

## 2024-09-21 ENCOUNTER — Emergency Department (HOSPITAL_COMMUNITY)

## 2024-09-21 DIAGNOSIS — S82851A Displaced trimalleolar fracture of right lower leg, initial encounter for closed fracture: Secondary | ICD-10-CM | POA: Insufficient documentation

## 2024-09-21 DIAGNOSIS — M7989 Other specified soft tissue disorders: Secondary | ICD-10-CM | POA: Insufficient documentation

## 2024-09-21 DIAGNOSIS — Y93F2 Activity, caregiving, lifting: Secondary | ICD-10-CM | POA: Insufficient documentation

## 2024-09-21 DIAGNOSIS — S99911A Unspecified injury of right ankle, initial encounter: Secondary | ICD-10-CM | POA: Diagnosis present

## 2024-09-21 DIAGNOSIS — Z7982 Long term (current) use of aspirin: Secondary | ICD-10-CM | POA: Insufficient documentation

## 2024-09-21 DIAGNOSIS — W108XXA Fall (on) (from) other stairs and steps, initial encounter: Secondary | ICD-10-CM | POA: Diagnosis not present

## 2024-09-21 MED ORDER — OXYCODONE-ACETAMINOPHEN 5-325 MG PO TABS: 1.0000 | ORAL_TABLET | Freq: Four times a day (QID) | ORAL | 0 refills | Status: AC | PRN

## 2024-09-21 MED ORDER — OXYCODONE-ACETAMINOPHEN 5-325 MG PO TABS
1.0000 | ORAL_TABLET | Freq: Once | ORAL | Status: AC
  Administered 2024-09-21: 1 via ORAL
  Filled 2024-09-21: qty 1

## 2024-09-21 MED ORDER — FENTANYL CITRATE (PF) 100 MCG/2ML IJ SOLN
50.0000 ug | Freq: Once | INTRAMUSCULAR | Status: AC
  Administered 2024-09-21: 50 ug via INTRAMUSCULAR
  Filled 2024-09-21: qty 2

## 2024-09-21 MED ORDER — PROPOFOL 10 MG/ML IV BOLUS
1.0000 mg/kg | Freq: Once | INTRAVENOUS | Status: DC
  Filled 2024-09-21: qty 20

## 2024-09-21 MED ORDER — SODIUM CHLORIDE 0.9 % IV BOLUS
1000.0000 mL | Freq: Once | INTRAVENOUS | Status: AC
  Administered 2024-09-21: 1000 mL via INTRAVENOUS

## 2024-09-21 NOTE — ED Triage Notes (Signed)
 Pt complains of right ankle pain after falling down the stairs this morning.

## 2024-09-21 NOTE — ED Provider Notes (Signed)
 [place] EMERGENCY DEPARTMENT AT [facility] Provider Note   CSN: [id number] Arrival date & time: 09/21/24  1335     Patient presents with: Ankle Pain   Regina [name] is a 58 y.o. female.   Patient is a 58 year old female who presents emergency department the chief complaint of right ankle pain.  Patient notes that she fell down approximately 6 stairs just prior to arrival while carrying laundry.  Patient notes that the fall was mechanical in nature with no preceding symptoms.  She did not strike her head or injure her neck or back during the fall.  She denies any other long bone or joint pain.  She denies any history of previous injuries or surgeries to the affected extremity.  She denies any numbness or paresthesias throughout.   Ankle Pain      Prior to Admission medications   Medication Sig Start Date End Date Taking? Authorizing Provider  aspirin EC 81 MG tablet Take 81 mg by mouth daily. Swallow whole.   Yes [provider]  ivermectin (STROMECTOL) 3 MG TABS tablet Take 150 mcg/kg by mouth once.   Yes [provider]  oxyCODONE-acetaminophen  (PERCOCET/ROXICET) 5-325 MG tablet Take 1 tablet by mouth every 6 (six) hours as needed for severe pain (pain score 7-10). 09/21/24  Yes Daralene Bruckner D, PA-C  oxyCODONE-acetaminophen  (PERCOCET/ROXICET) 5-325 MG tablet Take 1 tablet by mouth every 6 (six) hours as needed for severe pain (pain score 7-10). 09/21/24  Yes Daralene Bruckner BIRCH, PA-C    Allergies: Patient has no known allergies.    Review of Systems  Musculoskeletal:        Right ankle pain  All other systems reviewed and are negative.   Updated Vital Signs BP 126/67   Pulse 69   Temp 97.8 F (36.6 C) (Oral)   Resp (!) 22   Ht 5' 4 (1.626 m)   Wt 61.2 kg   LMP 02/15/2015   SpO2 96%   BMI 23.17 kg/m   Physical Exam Vitals and nursing note reviewed.  Constitutional:      General: She is not in acute distress.     Appearance: Normal appearance. She is not ill-appearing.  HENT:     Head: Normocephalic and atraumatic.     Nose: Nose normal.     Mouth/Throat:     Mouth: Mucous membranes are moist.  Eyes:     Extraocular Movements: Extraocular movements intact.     Conjunctiva/sclera: Conjunctivae normal.     Pupils: Pupils are equal, round, and reactive to light.  Cardiovascular:     Rate and Rhythm: Normal rate and regular rhythm.     Pulses: Normal pulses.     Heart sounds: Normal heart sounds. No murmur heard.    No gallop.  Pulmonary:     Effort: Pulmonary effort is normal. No respiratory distress.     Breath sounds: Normal breath sounds. No stridor. No wheezing, rhonchi or rales.  Chest:     Chest wall: No tenderness.  Abdominal:     General: Abdomen is flat. Bowel sounds are normal. There is no distension.     Palpations: Abdomen is soft.     Tenderness: There is no abdominal tenderness. There is no guarding.  Musculoskeletal:        General: Normal range of motion.     Cervical back: Normal range of motion and neck supple. No rigidity or tenderness.     Comments: Tender to palpation noted over the  right ankle diffusely, nontender palpation over right foot, knee, hip, deformity noted to the right ankle, DP and PT pulses are 2+ distally, sensation intact distally, nontender palpation the remainder of the of long bones and joints, pelvis stable to AP lateral compression, nontender palpation over thoracic or lumbar spine, no step-off or deformity, limited active and passive range of motion of right ankle secondary to pain, full range of motion noted to remainder of bilateral upper and lower extremities  Skin:    General: Skin is warm and dry.  Neurological:     General: No focal deficit present.     Mental Status: She is alert and oriented to person, place, and time. Mental status is at baseline.     Cranial Nerves: No cranial nerve deficit.     Sensory: No sensory deficit.     Motor: No  weakness.     Coordination: Coordination normal.  Psychiatric:        Mood and Affect: Mood normal.        Behavior: Behavior normal.        Thought Content: Thought content normal.        Judgment: Judgment normal.     (all labs ordered are listed, but only abnormal results are displayed) Labs Reviewed - No data to display  EKG: None  Radiology: DG Ankle 2 Views Right Result Date: 09/21/2024 EXAM: 2 VIEW(S) XRAY OF THE RIGHT ANKLE 09/21/2024 04:02:18 PM CLINICAL HISTORY: post reduction COMPARISON: Same day right ankle. FINDINGS: BONES AND JOINTS: The right ankle has been casted and immobilized. Significantly improved alignment of distal right tibial and fibular fractures is noted. No talar tibial dislocation is noted currently. No focal osseous lesion. SOFT TISSUES: The soft tissues are unremarkable. IMPRESSION: 1. Significantly improved alignment of distal right tibial and fibular fractures post reduction. 2. No tibiotalar dislocation. Electronically signed by: Lynwood Seip MD 09/21/2024 04:30 PM EDT RP Workstation: [id number]   DG Tibia/Fibula Right Result Date: 09/21/2024 CLINICAL DATA:  Right ankle pain after falling down stairs. EXAM: RIGHT TIBIA AND FIBULA - 2 VIEW; DG ANKLE COMPLETE 3+V*R* COMPARISON:  None Available. FINDINGS: There is a mildly displaced fracture of the distal fibular metadiaphysis. There is a displaced fracture of the posterior tibia with posterior dislocation of the talus relative to the distal tibia. There is a nondisplaced fracture of the medial malleolus. Soft tissue swelling is present about the ankle. IMPRESSION: Trimalleolar fracture with posterior dislocation of the talus at the ankle. Electronically Signed   By: Leita Birmingham M.D.   On: 09/21/2024 14:45   DG Ankle Complete Right Result Date: 09/21/2024 CLINICAL DATA:  Right ankle pain after falling down stairs. EXAM: RIGHT TIBIA AND FIBULA - 2 VIEW; DG ANKLE COMPLETE 3+V*R* COMPARISON:  None Available.  FINDINGS: There is a mildly displaced fracture of the distal fibular metadiaphysis. There is a displaced fracture of the posterior tibia with posterior dislocation of the talus relative to the distal tibia. There is a nondisplaced fracture of the medial malleolus. Soft tissue swelling is present about the ankle. IMPRESSION: Trimalleolar fracture with posterior dislocation of the talus at the ankle. Electronically Signed   By: Leita Birmingham M.D.   On: 09/21/2024 14:45     .Reduction of dislocation  Date/Time: 09/21/2024 5:19 PM  Performed by: Daralene Lonni BIRCH, PA-C Authorized by: Daralene Lonni BIRCH, PA-C  Consent: Verbal consent obtained Consent given by: patient Patient understanding: patient states understanding of the procedure being performed Patient consent: the patient's understanding of  the procedure matches consent given Procedure consent: procedure consent matches procedure scheduled Relevant documents: relevant documents present and verified Test results: test results available and properly labeled Site marked: the operative site was marked Imaging studies: imaging studies available Patient identity confirmed: verbally with patient, arm band, provided demographic data and hospital-assigned identification number Time out: Immediately prior to procedure a time out was called to verify the correct patient, procedure, equipment, support staff and site/side marked as required.  Sedation: Patient sedated: yes Sedation type: moderate (conscious) sedation Sedatives: propofol  Analgesia: fentanyl   Patient tolerance: patient tolerated the procedure well with no immediate complications Comments: Reduction of ankle dislocation performed with attending physician by distraction and volar pressure      Medications Ordered in the ED  propofol  (DIPRIVAN ) 10 mg/mL bolus/IV push 61.2 mg (has no administration in time range)  fentaNYL  (SUBLIMAZE ) injection 50 mcg (50 mcg Intramuscular  Given 09/21/24 1416)  sodium chloride  0.9 % bolus 1,000 mL (1,000 mLs Intravenous New Bag/Given 09/21/24 1554)  oxyCODONE-acetaminophen  (PERCOCET/ROXICET) 5-325 MG per tablet 1 tablet (1 tablet Oral Given 09/21/24 1714)                                    Medical Decision Making Patient is doing very well at this time and is stable for discharge home.  She has been monitored post moderate sedation and is doing well at this time.  Did discuss patient case with Dr. Sherida with orthopedics who was in agreement for splinting and discharge home with close outpatient follow-up.  Patient was neurovascularly intact distally before and after reduction of the dislocation and fracture.  Will continue pain control on outpatient basis.  The importance of close follow-up with orthopedics was discussed.  Strict turn precautions were provided as well for any new or worsening symptoms.  Reduction was performed with the assistance of attending physician.  Patient voiced understanding to the plan and had no additional questions.  Amount and/or Complexity of Data Reviewed Radiology: ordered.  Risk Prescription drug management.        Final diagnoses:  Closed trimalleolar fracture of right ankle, initial encounter    ED Discharge Orders          Ordered    oxyCODONE-acetaminophen  (PERCOCET/ROXICET) 5-325 MG tablet  Every 6 hours PRN        09/21/24 1715    oxyCODONE-acetaminophen  (PERCOCET/ROXICET) 5-325 MG tablet  Every 6 hours PRN        09/21/24 1715               Daralene Lonni BIRCH, PA-C 09/21/24 1722    Charlyn Sora, MD 09/22/24 0732

## 2024-09-21 NOTE — ED Notes (Signed)
 50mg  of propfol at 1545hrs 25mg  of propfol at 1548

## 2024-09-21 NOTE — Discharge Instructions (Addendum)
 Please leave the splint in place until evaluated by orthopedics.  Please utilize the crutches and do not bear weight on your right leg.  Return to emergency department immediately for any new or worsening symptoms.

## 2024-09-21 NOTE — ED Notes (Signed)
 Provider notified of pt feeling pressure to her injured L leg

## 2024-09-22 NOTE — ED Provider Notes (Signed)
 Physical Exam  BP 126/67   Pulse 75   Temp 97.8 F (36.6 C) (Oral)   Resp 20   Ht 5' 4 (1.626 m)   Wt 61.2 kg   LMP 02/15/2015   SpO2 97%   BMI 23.17 kg/m   Physical Exam Vitals and nursing note reviewed.  Constitutional:      Appearance: She is well-developed.  HENT:     Head: Atraumatic.  Cardiovascular:     Rate and Rhythm: Normal rate.  Pulmonary:     Effort: Pulmonary effort is normal.  Musculoskeletal:        General: Swelling and deformity present.     Comments: Right ankle deformity noted  Skin:    General: Skin is warm.  Neurological:     Mental Status: She is alert and oriented to person, place, and time.     Procedures  .Splint Application  Date/Time: 09/21/2024 4:00 PM  Performed by: Charlyn Sora, MD Authorized by: Charlyn Sora, MD   Consent:    Consent obtained:  Verbal   Consent given by:  Patient   Risks, benefits, and alternatives were discussed: yes     Risks discussed:  Pain, swelling and numbness Universal protocol:    Procedure explained and questions answered to patient or proxy's satisfaction: yes     Immediately prior to procedure a time out was called: yes     Patient identity confirmed:  Arm band Pre-procedure details:    Distal neurologic exam:  Normal   Distal perfusion: distal pulses strong   Procedure details:    Location:  Ankle   Ankle location:  R ankle   Strapping: yes     Splint type:  Short leg   Supplies:  Elastic bandage, plaster, cotton padding and fiberglass   Attestation: Splint applied and adjusted personally by me   Post-procedure details:    Distal neurologic exam:  Normal   Distal perfusion: distal pulses strong     Procedure completion:  Tolerated well, no immediate complications .Sedation  Date/Time: 09/21/2024 3:25 AM  Performed by: Charlyn Sora, MD Authorized by: Charlyn Sora, MD   Consent:    Consent obtained:  Written   Consent given by:  Patient   Risks discussed:  Allergic  reaction, prolonged hypoxia resulting in organ damage, prolonged sedation necessitating reversal, dysrhythmia, inadequate sedation, vomiting, respiratory compromise necessitating ventilatory assistance and intubation and nausea Universal protocol:    Procedure explained and questions answered to patient or proxy's satisfaction: yes     Relevant documents present and verified: yes     Test results available: yes     Imaging studies available: yes     Required blood products, implants, devices, and special equipment available: yes     Site/side marked: yes     Immediately prior to procedure, a time out was called: yes     Patient identity confirmed:  Arm band Indications:    Procedure performed:  Fracture reduction   Procedure necessitating sedation performed by:  Physician performing sedation Pre-sedation assessment:    Time since last food or drink:  4 hours   ASA classification: class 2 - patient with mild systemic disease     Mouth opening:  2 finger widths   Thyromental distance:  3 finger widths   Mallampati score:  III - soft palate, base of uvula visible   Neck mobility: normal     Pre-sedation assessments completed and reviewed: airway patency, cardiovascular function, hydration status, mental status, nausea/vomiting, pain level,  respiratory function and temperature   A pre-sedation assessment was completed prior to the start of the procedure Immediate pre-procedure details:    Reassessment: Patient reassessed immediately prior to procedure     Reviewed: vital signs, relevant labs/tests and NPO status     Verified: bag valve mask available, emergency equipment available, intubation equipment available and IV patency confirmed   Procedure details (see MAR for exact dosages):    Preoxygenation:  Nasal cannula   Sedation:  Propofol    Intended level of sedation: deep   Intra-procedure monitoring:  Blood pressure monitoring, continuous capnometry, frequent LOC assessments, frequent  vital sign checks, continuous pulse oximetry and cardiac monitor   Intra-procedure events: none     Total Provider sedation time (minutes):  27 Post-procedure details:   A post-sedation assessment was completed following the completion of the procedure.   Attendance: Constant attendance by certified staff until patient recovered     Post-sedation assessments completed and reviewed: airway patency, cardiovascular function, hydration status, mental status, nausea/vomiting, pain level, respiratory function and temperature     Patient is stable for discharge or admission: yes     Procedure completion:  Tolerated well, no immediate complications   ED Course / MDM    Medical Decision Making Amount and/or Complexity of Data Reviewed Radiology: ordered.  Risk Prescription drug management.    Postreduction film independently interpreted.  Patient splinted.       Charlyn Sora, MD 09/22/24 [phone]

## 2024-09-23 MED FILL — Oxycodone w/ Acetaminophen Tab 5-325 MG: ORAL | Qty: 6 | Status: AC

## 2024-09-23 NOTE — ED Notes (Signed)
 09/23/24 09:41- This RN faxed paperwork to [facility] per Marissa. Pt was called and given appointment time of 09/25/24 at 08:15 am.

## 2024-09-24 ENCOUNTER — Other Ambulatory Visit: Payer: Self-pay | Admitting: Family Medicine

## 2024-09-24 ENCOUNTER — Encounter: Payer: Self-pay | Admitting: Family Medicine

## 2024-09-24 DIAGNOSIS — M25571 Pain in right ankle and joints of right foot: Secondary | ICD-10-CM

## 2024-09-25 ENCOUNTER — Inpatient Hospital Stay: Admission: RE | Admit: 2024-09-25 | Discharge: 2024-09-25 | Attending: Family Medicine | Admitting: Family Medicine

## 2024-09-25 DIAGNOSIS — M25571 Pain in right ankle and joints of right foot: Secondary | ICD-10-CM
